# Patient Record
Sex: Female | Born: 1995 | Race: White | Hispanic: No | Marital: Married | Smoking: Current every day smoker
Health system: Southern US, Community
[De-identification: ages and names within clinical notes are randomized; demographics above are authoritative.]

## PROBLEM LIST (undated history)

## (undated) ENCOUNTER — Inpatient Hospital Stay: Payer: Self-pay

## (undated) DIAGNOSIS — J45909 Unspecified asthma, uncomplicated: Secondary | ICD-10-CM

---
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
## 2015-03-12 IMAGING — US US PELV - US TRANSVAGINAL
1 series · 14 of 25 positions shown · non-contrast
Comparison: None

CLINICAL DATA: Right lower pelvic pain.

EXAM:
TRANSABDOMINAL AND TRANSVAGINAL ULTRASOUND OF PELVIS
TECHNIQUE: Both transabdominal and transvaginal ultrasound examinations of the
pelvis were performed. Transabdominal technique was performed for
global imaging of the pelvis including uterus, ovaries, adnexal
regions, and pelvic cul-de-sac. It was necessary to proceed with
endovaginal exam following the transabdominal exam to visualize the
endometrium and ovaries.

[Series 1: us pelv - us transvaginal · 0.25mm/px · 14 of 56 slices shown]
[im 1/56]
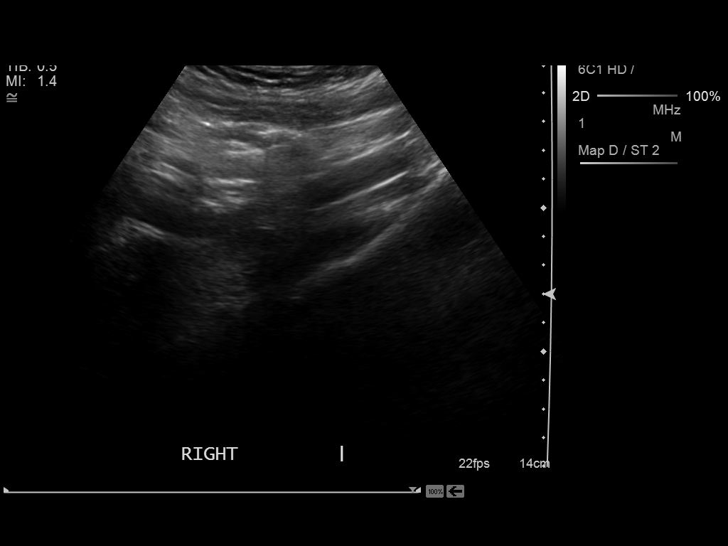
[im 5/56]
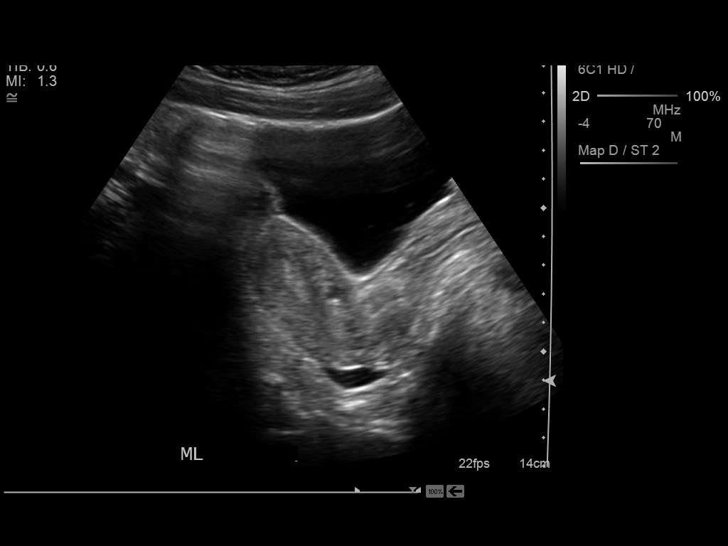
[im 10/56]
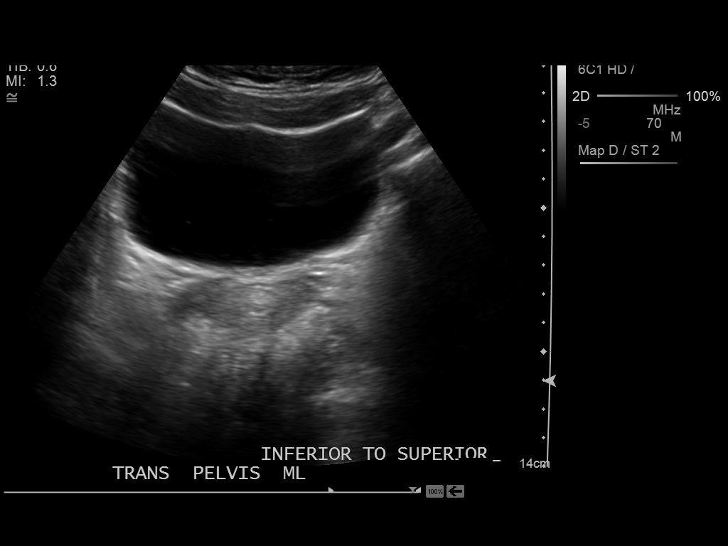
[im 14/56]
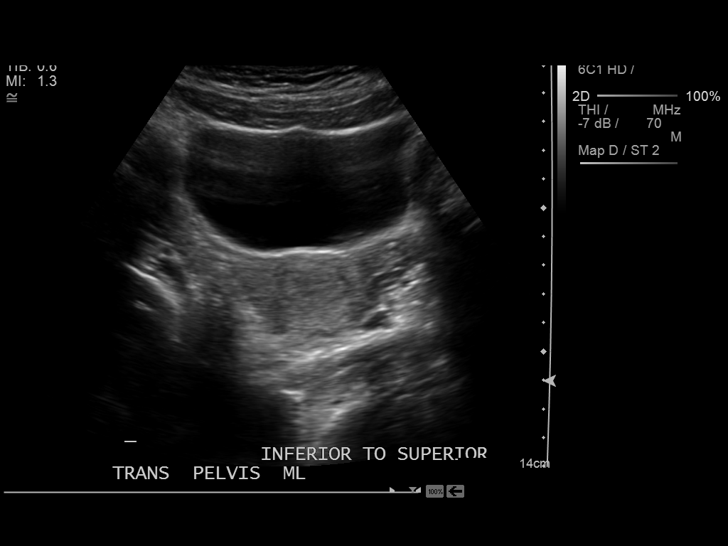
[im 19/56]
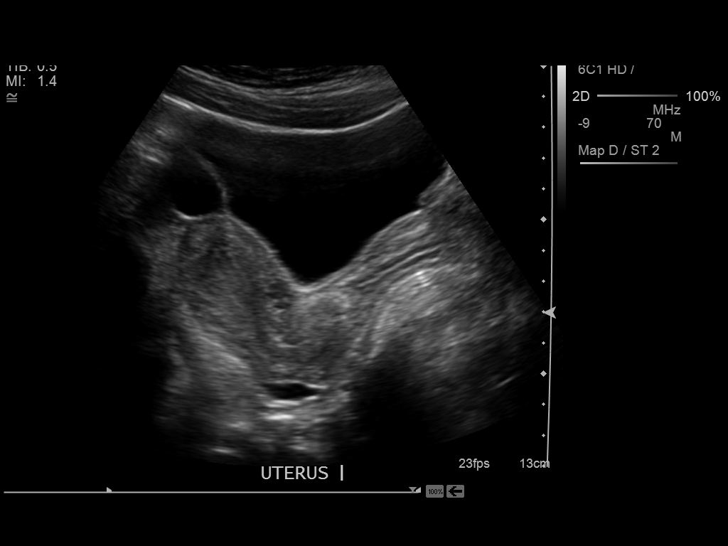
[im 21/56]
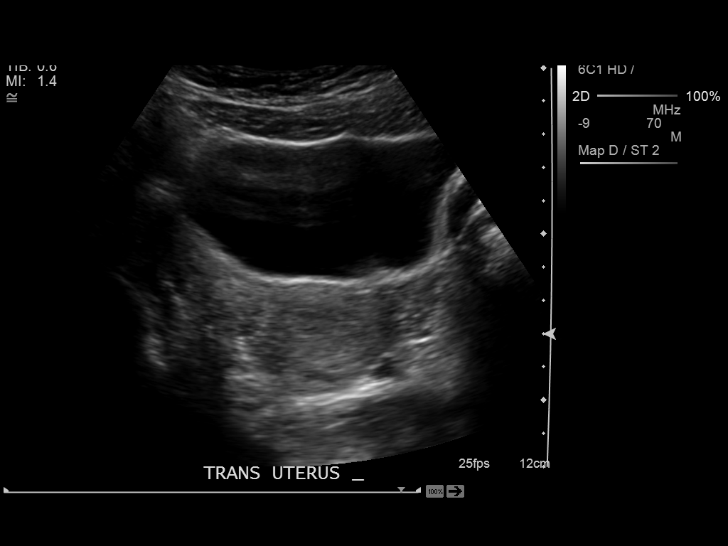
[im 26/56]
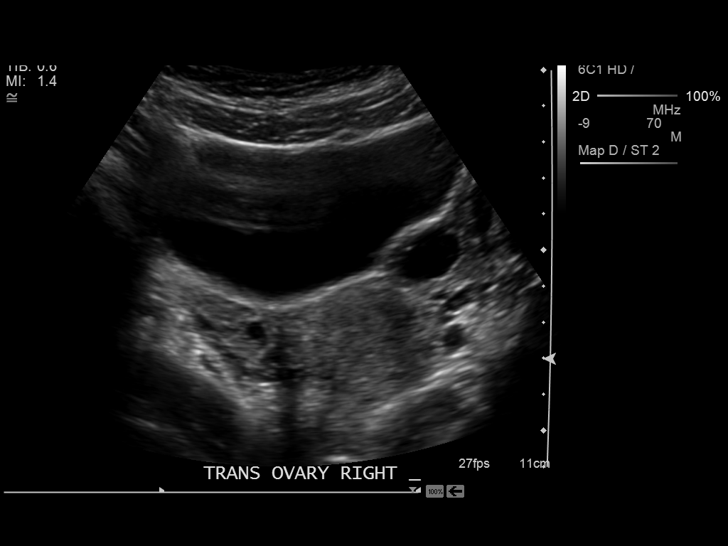
[im 30/56]
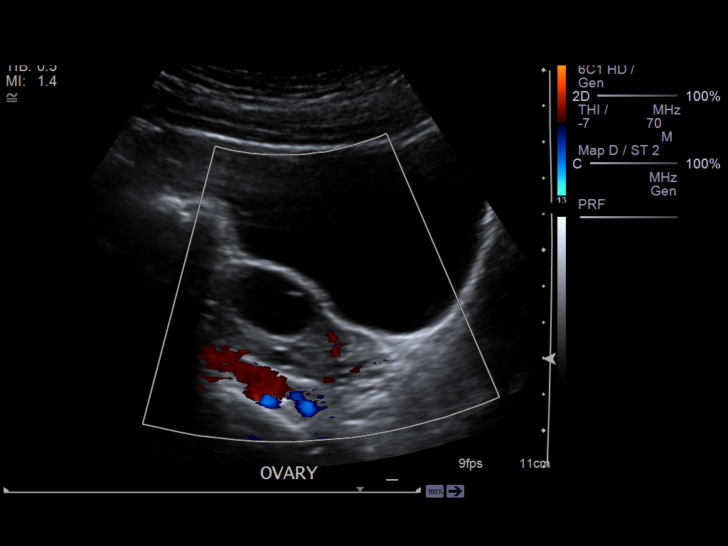
[im 35/56]
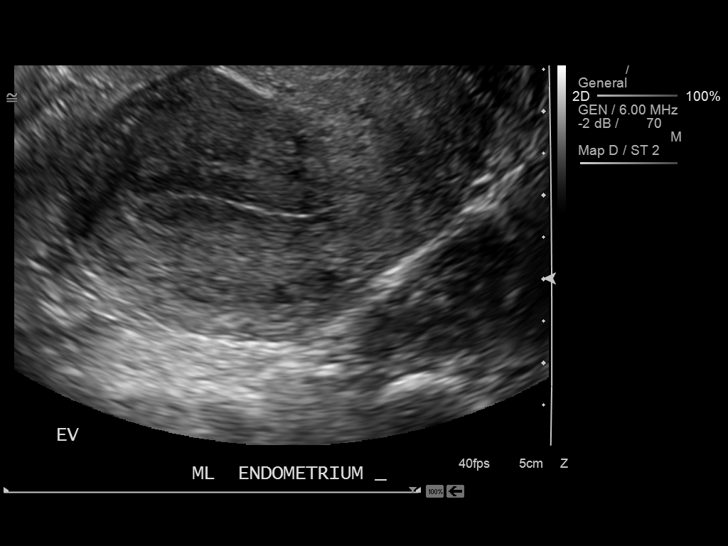
[im 37/56]
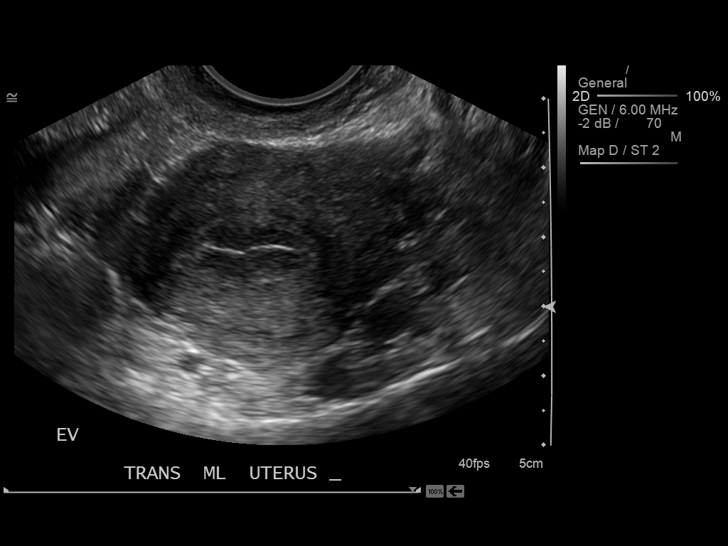
[im 42/56]
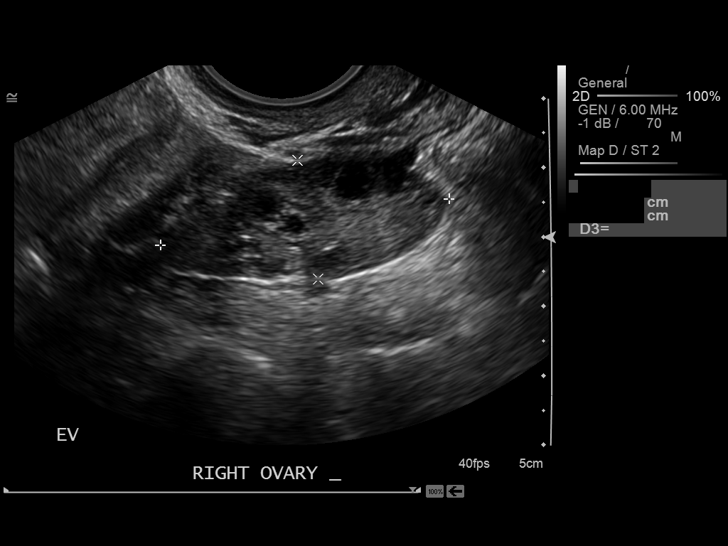
[im 46/56]
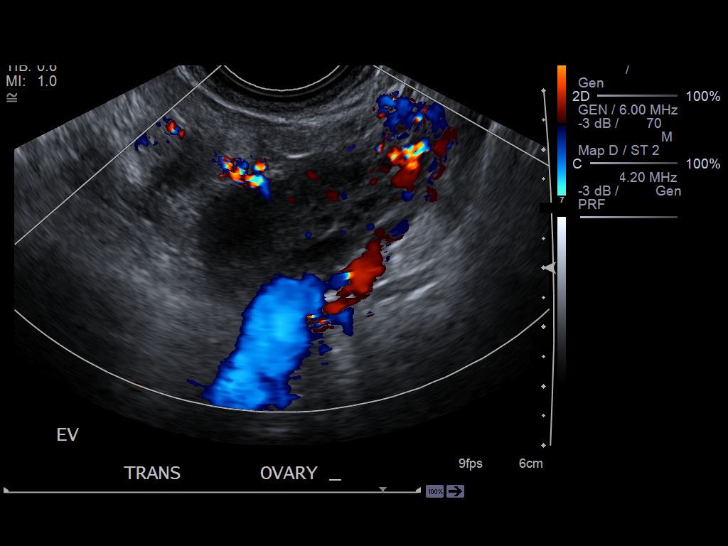
[im 51/56]
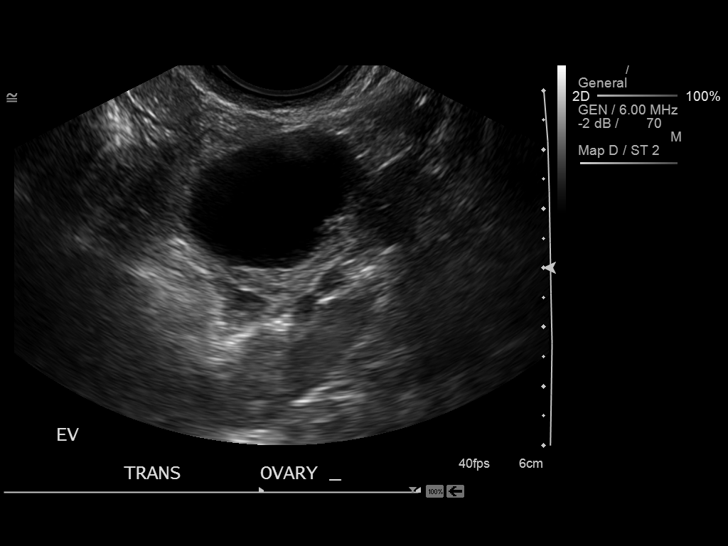
[im 56/56]
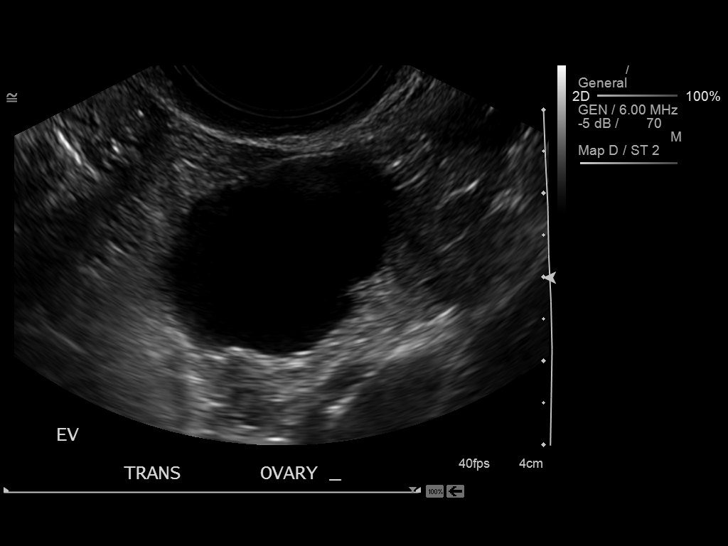

[14 of 25 positions shown; findings below may reference images not displayed]

FINDINGS: Uterus

Measurements: 7.1 x 4.5 x 3.3 cm. No fibroids or other mass
visualized.

Endometrium

Thickness: 9 mm.  No focal abnormality visualized.

Right ovary

Measurements: 4.2 x 2.3 x 1.7 cm. Normal appearance/no adnexal mass.

Left ovary

Measurements: 4.4 x 3.3 x 2.4 cm. 2.7 cm simple cyst is noted which
most likely is physiologic follicular cyst.

Other findings

Trace free fluid is noted in the pelvis which most likely is
physiologic.
IMPRESSION: No significant pelvic abnormality seen.

## 2015-11-01 ENCOUNTER — Encounter: Payer: Self-pay | Admitting: Emergency Medicine

## 2015-11-01 ENCOUNTER — Emergency Department
Admission: EM | Admit: 2015-11-01 | Discharge: 2015-11-02 | Disposition: A | Discharge status: Home / Self Care | Payer: Medicaid Other | Attending: Student in an Organized Health Care Education/Training Program | Admitting: Student in an Organized Health Care Education/Training Program

## 2015-11-01 ENCOUNTER — Emergency Department: Payer: Medicaid Other

## 2015-11-01 DIAGNOSIS — N644 Mastodynia: Secondary | ICD-10-CM | POA: Diagnosis not present

## 2015-11-01 DIAGNOSIS — O9229 Other disorders of breast associated with pregnancy and the puerperium: Secondary | ICD-10-CM

## 2015-11-01 DIAGNOSIS — Z87891 Personal history of nicotine dependence: Secondary | ICD-10-CM | POA: Diagnosis not present

## 2015-11-01 DIAGNOSIS — O209 Hemorrhage in early pregnancy, unspecified: Secondary | ICD-10-CM | POA: Insufficient documentation

## 2015-11-01 DIAGNOSIS — Z3A01 Less than 8 weeks gestation of pregnancy: Secondary | ICD-10-CM | POA: Insufficient documentation

## 2015-11-01 DIAGNOSIS — O26891 Other specified pregnancy related conditions, first trimester: Secondary | ICD-10-CM | POA: Diagnosis not present

## 2015-11-01 DIAGNOSIS — Z791 Long term (current) use of non-steroidal anti-inflammatories (NSAID): Secondary | ICD-10-CM | POA: Diagnosis not present

## 2015-11-01 DIAGNOSIS — O4690 Antepartum hemorrhage, unspecified, unspecified trimester: Secondary | ICD-10-CM

## 2015-11-01 DIAGNOSIS — J45909 Unspecified asthma, uncomplicated: Secondary | ICD-10-CM | POA: Insufficient documentation

## 2015-11-01 LAB — BASIC METABOLIC PANEL
ANION GAP: 9 (ref 5–15)
BUN: 8 mg/dL (ref 6–20)
CHLORIDE: 103 mmol/L (ref 101–111)
CO2: 25 mmol/L (ref 22–32)
Calcium: 8.8 mg/dL — ABNORMAL LOW (ref 8.9–10.3)
Creatinine, Ser: 0.55 mg/dL (ref 0.44–1.00)
GFR calc Af Amer: >60 mL/min (ref >60)
GLUCOSE: 117 mg/dL — AB (ref 65–99)
POTASSIUM: 3.7 mmol/L (ref 3.5–5.1)
SODIUM: 137 mmol/L (ref 135–145)

## 2015-11-01 LAB — URINALYSIS COMPLETE WITH MICROSCOPIC (ARMC ONLY)
BILIRUBIN URINE: NEGATIVE
Glucose, UA: NEGATIVE mg/dL
KETONES UR: NEGATIVE mg/dL
LEUKOCYTES UA: NEGATIVE
Nitrite: NEGATIVE
PH: 6 (ref 5.0–8.0)
PROTEIN: NEGATIVE mg/dL
Specific Gravity, Urine: 1.023 (ref 1.005–1.030)

## 2015-11-01 LAB — CBC
HCT: 38.8 % (ref 35.0–47.0)
HEMOGLOBIN: 13.5 g/dL (ref 12.0–16.0)
MCH: 31.2 pg (ref 26.0–34.0)
MCHC: 34.9 g/dL (ref 32.0–36.0)
MCV: 89.3 fL (ref 80.0–100.0)
PLATELETS: 200 10*3/uL (ref 150–440)
RBC: 4.35 MIL/uL (ref 3.80–5.20)
RDW: 12.5 % (ref 11.5–14.5)
WBC: 9.5 10*3/uL (ref 3.6–11.0)

## 2015-11-01 LAB — HCG, QUANTITATIVE, PREGNANCY: hCG, Beta Chain, Quant, S: 6873 m[IU]/mL — ABNORMAL HIGH (ref <5)

## 2015-11-01 NOTE — ED Notes (Addendum)
Pt. Returned to tx room from US. Pt. Returned to tx. room in stable condition.

## 2015-11-01 NOTE — ED Provider Notes (Signed)
[facility] Emergency Department Provider Note    First MD Initiated Contact with Patient 11/01/15 2240     (approximate)  I have reviewed the triage vital signs and the nursing notes.   HISTORY  Chief Complaint Vaginal Bleeding    HPI Katrina [name] is a 20 y.o. female G2P1001 who presents with 1 week of intermittent lower abdominal cramps and 1 day of a bright red and brown vaginal spotting. Patient denies any lightheadedness. No current abdominal pain. States that she's roughly 2 months by LMP but had only one cycle after having her IUD removed so it is uncertain of her menses timing. She denies any history of bleeding disorders. Has been established with OB/GYN. She is also complaining of right breast pain but denies any fevers.    Past Medical History:  Diagnosis Date  . Asthma     Patient Active Problem List   Diagnosis Date Noted  . Indication for care in labor or delivery 09/02/2014  . Lumbago 09/01/2014    History reviewed. No pertinent surgical history.  Prior to Admission medications   Medication Sig Start Date End Date Taking? Authorizing Provider  ibuprofen (ADVIL,MOTRIN) 600 MG tablet Take 1 tablet (600 mg total) by mouth every 6 (six) hours as needed for mild pain or moderate pain. 09/05/14   Nadara Mustardobert P Harris, MD  Prenatal Vit-Fe Fumarate-FA (PRENATAL MULTIVITAMIN) TABS tablet Take 1 tablet by mouth daily at 12 noon. 09/05/14   Nadara Mustardobert P Harris, MD    Allergies Review of patient's allergies indicates no known allergies.  No fam h/o bleeding disorders  Social History Social History  Substance Use Topics  . Smoking status: Former Games developermoker  . Smokeless tobacco: Never Used  . Alcohol use No    Review of Systems Patient denies headaches, rhinorrhea, blurry vision, numbness, shortness of breath, chest pain, edema, cough, abdominal pain, nausea, vomiting, diarrhea, dysuria, fevers, rashes or hallucinations unless otherwise stated  above in HPI. ____________________________________________   PHYSICAL EXAM:  VITAL SIGNS: Vitals:   11/01/15 2156  BP: 124/60  Pulse: 82  Resp: 16  Temp: 97.6 F (36.4 C)    Constitutional: Alert and oriented. Well appearing and in no acute distress. Eyes: Conjunctivae are normal. PERRL. EOMI. Head: Atraumatic. Nose: No congestion/rhinnorhea. Mouth/Throat: Mucous membranes are moist.  Oropharynx non-erythematous. Neck: No stridor. Painless ROM. No cervical spine tenderness to palpation Hematological/Lymphatic/Immunilogical: No cervical lymphadenopathy. Cardiovascular: Normal rate, regular rhythm. Grossly normal heart sounds.  Good peripheral circulation. Respiratory: Normal respiratory effort.  No retractions. Lungs CTAB. Gastrointestinal: Soft and nontender. No distention. No abdominal bruits. No CVA tenderness. Genitourinary: Healthy-appearing cervix with scant vaginal blood. No evidence of active bleeding. Cervix is closed. No vaginal lesions no vaginal discharge. Abdominal exam is soft and benign. Musculoskeletal: No lower extremity tenderness nor edema.  No joint effusions. Neurologic:  Normal speech and language. No gross focal neurologic deficits are appreciated. No gait instability. Skin:  Skin is warm, dry and intact. No rash noted. Psychiatric: Mood and affect are normal. Speech and behavior are normal.  ____________________________________________   LABS (all labs ordered are listed, but only abnormal results are displayed)  Results for orders placed or performed during the hospital encounter of 11/01/15 (from the past 24 hour(s))  hCG, quantitative, pregnancy     Status: Abnormal   Collection Time: 11/01/15  9:57 PM  Result Value Ref Range   hCG, Beta Chain, Quant, S 6,873 (H) <5 mIU/mL  CBC     Status:  None   Collection Time: 11/01/15  9:57 PM  Result Value Ref Range   WBC 9.5 3.6 - 11.0 K/uL   RBC 4.35 3.80 - 5.20 MIL/uL   Hemoglobin 13.5 12.0 - 16.0 g/dL     HCT 16.138.8 09.635.0 - 04.547.0 %   MCV 89.3 80.0 - 100.0 fL   MCH 31.2 26.0 - 34.0 pg   MCHC 34.9 32.0 - 36.0 g/dL   RDW 40.912.5 81.111.5 - 91.414.5 %   Platelets 200 150 - 440 K/uL  Basic metabolic panel     Status: Abnormal   Collection Time: 11/01/15  9:57 PM  Result Value Ref Range   Sodium 137 135 - 145 mmol/L   Potassium 3.7 3.5 - 5.1 mmol/L   Chloride 103 101 - 111 mmol/L   CO2 25 22 - 32 mmol/L   Glucose, Bld 117 (H) 65 - 99 mg/dL   BUN 8 6 - 20 mg/dL   Creatinine, Ser 7.820.55 0.44 - 1.00 mg/dL   Calcium 8.8 (L) 8.9 - 10.3 mg/dL   GFR calc non Af Amer >60 >60 mL/min   GFR calc Af Amer >60 >60 mL/min   Anion gap 9 5 - 15  Urinalysis complete, with microscopic ([facility] only)     Status: Abnormal   Collection Time: 11/01/15  9:57 PM  Result Value Ref Range   Color, Urine YELLOW (A) YELLOW   APPearance HAZY (A) CLEAR   Glucose, UA NEGATIVE NEGATIVE mg/dL   Bilirubin Urine NEGATIVE NEGATIVE   Ketones, ur NEGATIVE NEGATIVE mg/dL   Specific Gravity, Urine 1.023 1.005 - 1.030   Hgb urine dipstick 1+ (A) NEGATIVE   pH 6.0 5.0 - 8.0   Protein, ur NEGATIVE NEGATIVE mg/dL   Nitrite NEGATIVE NEGATIVE   Leukocytes, UA NEGATIVE NEGATIVE   RBC / HPF 0-5 0 - 5 RBC/hpf   WBC, UA 0-5 0 - 5 WBC/hpf   Bacteria, UA RARE (A) NONE SEEN   Squamous Epithelial / LPF 6-30 (A) NONE SEEN   Mucous PRESENT    ____________________________________________  EKG____________________________________________  RADIOLOGY  I personally reviewed all radiographic images ordered to evaluate for the above acute complaints and reviewed radiology reports and findings.  These findings were personally discussed with the patient.  Please see medical record for radiology report.  ____________________________________________   PROCEDURES  Procedure(s) performed: none    Critical Care performed: no ____________________________________________   INITIAL IMPRESSION / ASSESSMENT AND PLAN / ED COURSE  Pertinent labs & imaging  results that were available during my care of the patient were reviewed by me and considered in my medical decision making (see chart for details).  DDX: ectopic, misscarriage, threatened ab, dub, menses  Kimberlye Pollie MeyerLeigh Ambrosino is a 20 y.o. who presents to the ED with  Early vaginal bleeding and early pregnancy. Patient hemodynamic stable. Ultrasound ordered to evaluate for intrauterine pregnancy shows evidence of intrauterine gestational sac. Beta Quant is 6000. Based on her exam think that she does have threatened AB in early pregnancy. Likely 5-6 weeks. She has otherwise reassuring exam. We will discharge with instructions for follow-up in OB clinic in 2-3 days for repeat Quant. Patient also complaining of right breast pain. Rh-.  Doesn't feel engorged without any evidence of abscess or overlying cellulitis. Patient just recently stopped breast-feeding her son and is likely suffering from engorgement. Encourage conservative measures. Do not feel that antibiotics clinically indicated at this time.  Have discussed with the patient and available family all diagnostics and treatments performed  thus far and all questions were answered to the best of my ability. The patient demonstrates understanding and agreement with plan.   Clinical Course     ____________________________________________   FINAL CLINICAL IMPRESSION(S) / ED DIAGNOSES  Final diagnoses:  Vaginal bleeding in pregnancy  Breast pain during pregnancy      NEW MEDICATIONS STARTED DURING THIS VISIT:  New Prescriptions   No medications on file     Note:  This document was prepared using Dragon voice recognition software and may include unintentional dictation errors.    Willy Eddy, MD 11/01/15 [phone]

## 2015-11-01 NOTE — ED Triage Notes (Signed)
Patient ambulatory to triage with steady gait, without difficulty or distress noted; pt reports vag bleeding and abd cramping, approx 2mos pregnant

## 2015-11-01 NOTE — ED Notes (Signed)
MD Robinson at bedside at this time.  

## 2015-11-02 NOTE — ED Notes (Signed)
Discharge instructions reviewed with patient. Questions fielded by this RN. Patient verbalizes understanding of instructions. Patient discharged home in stable condition per Robinson MD . No acute distress noted at time of discharge.   

## 2015-11-03 ENCOUNTER — Emergency Department
Admission: EM | Admit: 2015-11-03 | Discharge: 2015-11-03 | Disposition: A | Discharge status: Home / Self Care | Payer: Medicaid Other | Attending: Emergency Medicine | Admitting: Emergency Medicine

## 2015-11-03 ENCOUNTER — Encounter: Payer: Self-pay | Admitting: Emergency Medicine

## 2015-11-03 DIAGNOSIS — J45909 Unspecified asthma, uncomplicated: Secondary | ICD-10-CM | POA: Diagnosis not present

## 2015-11-03 DIAGNOSIS — O200 Threatened abortion: Secondary | ICD-10-CM | POA: Insufficient documentation

## 2015-11-03 DIAGNOSIS — Z791 Long term (current) use of non-steroidal anti-inflammatories (NSAID): Secondary | ICD-10-CM | POA: Diagnosis not present

## 2015-11-03 DIAGNOSIS — Z87891 Personal history of nicotine dependence: Secondary | ICD-10-CM | POA: Diagnosis not present

## 2015-11-03 DIAGNOSIS — O209 Hemorrhage in early pregnancy, unspecified: Secondary | ICD-10-CM | POA: Diagnosis present

## 2015-11-03 DIAGNOSIS — Z3A08 8 weeks gestation of pregnancy: Secondary | ICD-10-CM | POA: Diagnosis not present

## 2015-11-03 LAB — BASIC METABOLIC PANEL
Anion gap: 9 (ref 5–15)
BUN: 8 mg/dL (ref 6–20)
CALCIUM: 8.8 mg/dL — AB (ref 8.9–10.3)
CO2: 22 mmol/L (ref 22–32)
CREATININE: 0.57 mg/dL (ref 0.44–1.00)
Chloride: 106 mmol/L (ref 101–111)
GFR calc non Af Amer: >60 mL/min (ref >60)
GLUCOSE: 85 mg/dL (ref 65–99)
Potassium: 3.9 mmol/L (ref 3.5–5.1)
Sodium: 137 mmol/L (ref 135–145)

## 2015-11-03 LAB — CBC
HEMATOCRIT: 38.9 % (ref 35.0–47.0)
Hemoglobin: 13.9 g/dL (ref 12.0–16.0)
MCH: 31.8 pg (ref 26.0–34.0)
MCHC: 35.7 g/dL (ref 32.0–36.0)
MCV: 89.1 fL (ref 80.0–100.0)
Platelets: 192 10*3/uL (ref 150–440)
RBC: 4.36 MIL/uL (ref 3.80–5.20)
RDW: 12.3 % (ref 11.5–14.5)
WBC: 7.6 10*3/uL (ref 3.6–11.0)

## 2015-11-03 LAB — HCG, QUANTITATIVE, PREGNANCY: HCG, BETA CHAIN, QUANT, S: 5583 m[IU]/mL — AB (ref <5)

## 2015-11-03 MED ORDER — SODIUM CHLORIDE 0.9 % IV BOLUS (SEPSIS): 1000.0000 mL | Freq: Once | INTRAVENOUS | Status: DC

## 2015-11-03 NOTE — ED Triage Notes (Signed)
Pt seen here on 10/31, approx [redacted] weeks pregnant. Reports some brown vaginal bleeding. Woke up this AM with bright red blood, filled 3 pads in 3 hours and is having lower abdominal cramping.

## 2015-11-03 NOTE — ED Notes (Signed)
Red, purple and light green top sent.

## 2015-11-03 NOTE — ED Notes (Signed)
Pt to the bathroom. Pt given gown and pelvic cart at doorway

## 2015-11-03 NOTE — ED Notes (Signed)
Hold on type and screen for now per dr.

## 2015-11-03 NOTE — ED Provider Notes (Signed)
[facility] Emergency Department Provider Note  ____________________________________________  Time seen: Approximately 2:20 PM  I have reviewed the triage vital signs and the nursing notes.   HISTORY  Chief Complaint Vaginal Bleeding   HPI Katrina [name] is a 20 y.o. female G2P1001 who presents for evaluation of worsening vaginal bleeding. Patient was seen here 2 days ago complaining of vaginal bleeding. Patient was roughly 2 months from her last menstrual period. She had a transvaginal ultrasound showing a early intrauterine gestational sac but no fetal pole. Her hCG was 6873. Patient was discharged home to follow up with primary care doctor in 2 weeks for repeat ultrasound. She complains that she continues to bleed and today the bleeding intensified. She reports that she's been changing a pad an hour and also has had some mild lightheadedness which prompted her visit to the emergency room. She reports that she was having abdominal cramping on her last visit however abdominal pain has resolved. No nausea, no vomiting, no chest pain, no shortness of breath, no vaginal discharge, no dysuria.  Past Medical History:  Diagnosis Date  . Asthma     Patient Active Problem List   Diagnosis Date Noted  . Indication for care in labor or delivery 09/02/2014  . Lumbago 09/01/2014    No past surgical history on file.  Prior to Admission medications   Medication Sig Start Date End Date Taking? Authorizing Provider  ibuprofen (ADVIL,MOTRIN) 600 MG tablet Take 1 tablet (600 mg total) by mouth every 6 (six) hours as needed for mild pain or moderate pain. 09/05/14   Nadara Mustardobert P Harris, MD  Prenatal Vit-Fe Fumarate-FA (PRENATAL MULTIVITAMIN) TABS tablet Take 1 tablet by mouth daily at 12 noon. 09/05/14   Nadara Mustardobert P Harris, MD    Allergies Review of patient's allergies indicates no known allergies.  No family history on file.  Social History Social History  Substance Use  Topics  . Smoking status: Former Games developermoker  . Smokeless tobacco: Never Used  . Alcohol use No    Review of Systems  Constitutional: Negative for fever. + Lightheadedness Eyes: Negative for visual changes. ENT: Negative for sore throat. Cardiovascular: Negative for chest pain. Respiratory: Negative for shortness of breath. Gastrointestinal: Negative for abdominal pain, vomiting or diarrhea. Genitourinary: Negative for dysuria. + vaginal bleeding Musculoskeletal: Negative for back pain. Skin: Negative for rash. Neurological: Negative for headaches, weakness or numbness.  ____________________________________________   PHYSICAL EXAM:  VITAL SIGNS: ED Triage Vitals  Enc Vitals Group     BP 11/03/15 1300 124/67     Pulse Rate 11/03/15 1300 86     Resp 11/03/15 1300 16     Temp 11/03/15 1300 98 F (36.7 C)     Temp Source 11/03/15 1300 Oral     SpO2 11/03/15 1300 100 %     Weight 11/03/15 1300 102 lb (46.3 kg)     Height 11/03/15 1300 4\' 10"  (1.473 m)     Head Circumference --      Peak Flow --      Pain Score 11/03/15 1302 0     Pain Loc --      Pain Edu? --      Excl. in GC? --     Constitutional: Alert and oriented. Well appearing and in no apparent distress. HEENT:      Head: Normocephalic and atraumatic.         Eyes: Conjunctivae are normal. Sclera is non-icteric. EOMI. PERRL  Mouth/Throat: Mucous membranes are moist.       Neck: Supple with no signs of meningismus. Cardiovascular: Regular rate and rhythm. No murmurs, gallops, or rubs. 2+ symmetrical distal pulses are present in all extremities. No JVD. Respiratory: Normal respiratory effort. Lungs are clear to auscultation bilaterally. No wheezes, crackles, or rhonchi.  Gastrointestinal: Soft, non tender, and non distended with positive bowel sounds. No rebound or guarding. Pelvic exam: Normal external genitalia, no rashes or lesions. Normal cervical mucus. Os closed with small amount of blood coming from the os  into the vaginal vault. No cervical motion tenderness.  No uterine or adnexal tenderness.   Musculoskeletal: Nontender with normal range of motion in all extremities. No edema, cyanosis, or erythema of extremities. Neurologic: Normal speech and language. Face is symmetric. Moving all extremities. No gross focal neurologic deficits are appreciated. Skin: Skin is warm, dry and intact. No rash noted. Psychiatric: Mood and affect are normal. Speech and behavior are normal.  ____________________________________________   LABS (all labs ordered are listed, but only abnormal results are displayed)  Labs Reviewed  HCG, QUANTITATIVE, PREGNANCY - Abnormal; Notable for the following:       Result Value   hCG, Beta Chain, Quant, S 5,583 (*)    All other components within normal limits  BASIC METABOLIC PANEL - Abnormal; Notable for the following:    Calcium 8.8 (*)    All other components within normal limits  CBC  TYPE AND SCREEN   ____________________________________________  EKG  none ____________________________________________  RADIOLOGY  none  ____________________________________________   PROCEDURES  Procedure(s) performed: None Procedures Critical Care performed:  None ____________________________________________   INITIAL IMPRESSION / ASSESSMENT AND PLAN / ED COURSE  20 y.o. female G2P1001 currently at estimated [redacted] weeks GA by LMP who presents for evaluation of worsening vaginal bleeding since being seen here 2 days ago. Patient is well-appearing, in no distress, has normal vital signs, her abdomen is soft and nontender throughout, pelvic exam showed a closed os with small amount of blood coming from it. Patient confirmatory intrauterine gestational sac on ultrasound 2 days ago and with no abdominal pain therefore no indication for imaging at this time. We'll check her blood counts and if that's normal we'll discharge home with follow-up with PCP. I explained to the patient  that the fact that her beta Sharene ButtersQuant is trending down that this is consistent with a miscarriage.  Clinical Course   _________________________ 2:41 PM on 11/03/2015 ----------------------------------------- Patient with normal blood count with hemoglobin of 13.9 and normal kidney function. Patient's blood type is A+ so no indication for [place]. We'll discharge home with supportive care follow-up with primary care doctor.   Pertinent labs & imaging results that were available during my care of the patient were reviewed by me and considered in my medical decision making (see chart for details).    ____________________________________________   FINAL CLINICAL IMPRESSION(S) / ED DIAGNOSES  Final diagnoses:  Threatened miscarriage      NEW MEDICATIONS STARTED DURING THIS VISIT:  New Prescriptions   No medications on file     Note:  This document was prepared using Dragon voice recognition software and may include unintentional dictation errors.    Nita Sicklearolina Romin Divita, MD 11/03/15 [phone]

## 2015-11-06 ENCOUNTER — Encounter: Payer: Self-pay | Admitting: Emergency Medicine

## 2015-11-06 ENCOUNTER — Emergency Department
Admission: EM | Admit: 2015-11-06 | Discharge: 2015-11-06 | Disposition: A | Discharge status: Home / Self Care | Payer: Medicaid Other | Attending: Emergency Medicine | Admitting: Emergency Medicine

## 2015-11-06 DIAGNOSIS — J45909 Unspecified asthma, uncomplicated: Secondary | ICD-10-CM | POA: Diagnosis not present

## 2015-11-06 DIAGNOSIS — O039 Complete or unspecified spontaneous abortion without complication: Secondary | ICD-10-CM | POA: Diagnosis not present

## 2015-11-06 DIAGNOSIS — Z791 Long term (current) use of non-steroidal anti-inflammatories (NSAID): Secondary | ICD-10-CM | POA: Insufficient documentation

## 2015-11-06 DIAGNOSIS — N898 Other specified noninflammatory disorders of vagina: Secondary | ICD-10-CM | POA: Insufficient documentation

## 2015-11-06 DIAGNOSIS — Z87891 Personal history of nicotine dependence: Secondary | ICD-10-CM | POA: Diagnosis not present

## 2015-11-06 DIAGNOSIS — O209 Hemorrhage in early pregnancy, unspecified: Secondary | ICD-10-CM | POA: Diagnosis present

## 2015-11-06 LAB — BASIC METABOLIC PANEL
ANION GAP: 8 (ref 5–15)
BUN: 6 mg/dL (ref 6–20)
CALCIUM: 9.3 mg/dL (ref 8.9–10.3)
CO2: 26 mmol/L (ref 22–32)
CREATININE: 0.5 mg/dL (ref 0.44–1.00)
Chloride: 104 mmol/L (ref 101–111)
Glucose, Bld: 98 mg/dL (ref 65–99)
Potassium: 3.9 mmol/L (ref 3.5–5.1)
SODIUM: 138 mmol/L (ref 135–145)

## 2015-11-06 LAB — CBC
HEMATOCRIT: 37.6 % (ref 35.0–47.0)
Hemoglobin: 13.4 g/dL (ref 12.0–16.0)
MCH: 31.4 pg (ref 26.0–34.0)
MCHC: 35.7 g/dL (ref 32.0–36.0)
MCV: 88.1 fL (ref 80.0–100.0)
PLATELETS: 230 10*3/uL (ref 150–440)
RBC: 4.27 MIL/uL (ref 3.80–5.20)
RDW: 12.5 % (ref 11.5–14.5)
WBC: 7.3 10*3/uL (ref 3.6–11.0)

## 2015-11-06 LAB — HCG, QUANTITATIVE, PREGNANCY: hCG, Beta Chain, Quant, S: 3081 m[IU]/mL — ABNORMAL HIGH (ref <5)

## 2015-11-06 LAB — TYPE AND SCREEN
ABO/RH(D): A POS
ANTIBODY SCREEN: NEGATIVE

## 2015-11-06 MED ORDER — HYDROCODONE-ACETAMINOPHEN 5-325 MG PO TABS: 1.0000 | ORAL_TABLET | ORAL | 0 refills | Status: DC | PRN

## 2015-11-06 NOTE — Discharge Instructions (Signed)
Please take ibuprofen as needed for discomfort, you may take her prescribed pain medication if needed for significant discomfort. Please return to the emergency department for any significant increase in bleeding, or any increased abdominal discomfort. Your workup today is unfortunately consistent with an active miscarriage. As we discussed bleeding will typically last 5-7 days and will be heavier than a typical period. Please call your doctor/OB/GYN for repeat evaluation in the next several days.

## 2015-11-06 NOTE — ED Triage Notes (Signed)
Pt reports recently had miscarriage. Pt states seen  10/31 and 11/02. Pt states was told she had a threatened miscarriage and the bleeding could continue for two weeks. Pt states the bleeding is getting worse and she is passing clots. Pt states she has had to use four pads in the last four hours.

## 2015-11-06 NOTE — ED Provider Notes (Addendum)
[facility] Emergency Department Provider Note  Time seen: 1:53 PM  I have reviewed the triage vital signs and the nursing notes.   HISTORY  Chief Complaint Vaginal Bleeding    HPI Katrina [name] is a 20 y.o. female With a past medical history of asthma, G2 P1 who presents the emergency department with a likely miscarriage. According to the patient she began having very light spotting about 1 week ago. She was seen in the emergency department 11/01/15 diagnosed with a threatened miscarriage at that time. She returned 2 days later 11/03/15 and was found have a declining beta hCG level, and told that she was likely miscarrying. Patient states approximately 4 hours ago she began with heavy bleeding with clot production. States she became concerned so she came to the emergency department for evaluation. Patient states moderate lower abdominal cramping. States she was going through 1 pad per hour. States she began feeling a little lightheaded so she came to the emergency department.  Past Medical History:  Diagnosis Date  . Asthma     Patient Active Problem List   Diagnosis Date Noted  . Indication for care in labor or delivery 09/02/2014  . Lumbago 09/01/2014    No past surgical history on file.  Prior to Admission medications   Medication Sig Start Date End Date Taking? Authorizing Provider  ibuprofen (ADVIL,MOTRIN) 600 MG tablet Take 1 tablet (600 mg total) by mouth every 6 (six) hours as needed for mild pain or moderate pain. 09/05/14   Nadara Mustardobert P Harris, MD  Prenatal Vit-Fe Fumarate-FA (PRENATAL MULTIVITAMIN) TABS tablet Take 1 tablet by mouth daily at 12 noon. 09/05/14   Nadara Mustardobert P Harris, MD    No Known Allergies  No family history on file.  Social History Social History  Substance Use Topics  . Smoking status: Former Games developermoker  . Smokeless tobacco: Never Used  . Alcohol use No    Review of Systems Constitutional: Negative for fever. Cardiovascular:  Negative for chest pain. Respiratory: Negative for shortness of breath. Gastrointestinal: lower abdominal cramping, moderate. Genitourinary: Negative for dysuria.positive for vaginal bleeding. Neurological: Negative for headache 10-point ROS otherwise negative.  ____________________________________________   PHYSICAL EXAM:  VITAL SIGNS: ED Triage Vitals  Enc Vitals Group     BP 11/06/15 1257 108/64     Pulse Rate 11/06/15 1257 98     Resp 11/06/15 1257 17     Temp 11/06/15 1257 98.2 F (36.8 C)     Temp Source 11/06/15 1257 Oral     SpO2 11/06/15 1257 99 %     Weight 11/06/15 1258 102 lb (46.3 kg)     Height 11/06/15 1258 4\' 10"  (1.473 m)     Head Circumference --      Peak Flow --      Pain Score 11/06/15 1302 0     Pain Loc --      Pain Edu? --      Excl. in GC? --     Constitutional: Alert and oriented. Well appearing and in no distress. Eyes: Normal exam ENT   Head: Normocephalic and atraumatic.   Mouth/Throat: Mucous membranes are moist. Cardiovascular: Normal rate, regular rhythm. No murmurs, rubs, or gallops. Respiratory: Normal respiratory effort without tachypnea nor retractions. Breath sounds are clear  Gastrointestinal: soft, moderate suprapubic tenderness palpation. No rebound or guarding. No distention. Musculoskeletal: Nontender with normal range of motion in all extremities. No lower extremity tenderness or edema. Neurologic:  Normal speech and language. No  gross focal neurologic deficits  Skin:  Skin is warm, dry and intact.  Psychiatric: Mood and affect are normal. Speech and behavior are normal.   ____________________________________________    INITIAL IMPRESSION / ASSESSMENT AND PLAN / ED COURSE  Pertinent labs & imaging results that were available during my care of the patient were reviewed by me and considered in my medical decision making (see chart for details).  patient presents to the emergency department with heavy vaginal bleeding  for the past 4 hours. Overall the patient appears very well, no distress, moderate suprapubic tenderness palpation but no rebound or guarding. Patient had not some performed 11/01/15 showing a possible gestational sac. We're currently awaiting beta hCG level from today. I anticipate that it has declined substantially and the patient is likely undergoing an active miscarriage. We will perform a pelvic examination while awaiting lab results.  Patient's H&H is normal. Currently awaiting beta hCG level.  Pelvic exam shows a moderate size clot in the vaginal vault, cervix appears normal, minimal bleeding from the cervix at this time. Exam consistent with uterine bleeding likely miscarriage. Beta-hCG continues to decline also consistent with miscarriage.  ____________________________________________   FINAL CLINICAL IMPRESSION(S) / ED DIAGNOSES  miscarriage    Minna AntisKevin Dontavia Brand, MD 11/06/15 1431    Minna AntisKevin Monna Crean, MD 11/06/15 [phone]

## 2015-11-06 NOTE — ED Notes (Signed)
Pt alert and oriented X4, active, cooperative, pt in NAD. RR even and unlabored, color WNL.  Pt informed to return if any life threatening symptoms occur.   

## 2015-11-06 NOTE — ED Notes (Addendum)
Pt c/o heavy vaginal bleeding that started at 12AM today. Pt here recently for miscarriage. Denies pain, but does appear uncomfortable when abdomen palpated. PT reports mild fatigue but requests to try PO fluids vs IV fluids when MD at bedside. Pt concerned with large blood clot that she has passed and heavy amount of bleeding (4 pads in 1 hour). Pt given large cup of water and snack. Given more pads and underwear. Pt alert and oriented X4, active, cooperative, pt in NAD. RR even and unlabored, color WNL.  Pt ambulates with ease.

## 2016-03-06 ENCOUNTER — Other Ambulatory Visit: Payer: Self-pay | Admitting: Obstetrics & Gynecology

## 2016-03-11 ENCOUNTER — Emergency Department
Admission: EM | Admit: 2016-03-11 | Discharge: 2016-03-11 | Disposition: A | Discharge status: Home / Self Care | Payer: Medicaid Other | Attending: Emergency Medicine | Admitting: Emergency Medicine

## 2016-03-11 ENCOUNTER — Emergency Department: Payer: Medicaid Other

## 2016-03-11 DIAGNOSIS — Z87891 Personal history of nicotine dependence: Secondary | ICD-10-CM | POA: Insufficient documentation

## 2016-03-11 DIAGNOSIS — J45901 Unspecified asthma with (acute) exacerbation: Secondary | ICD-10-CM | POA: Insufficient documentation

## 2016-03-11 MED ORDER — ALBUTEROL SULFATE HFA 108 (90 BASE) MCG/ACT IN AERS: 2.0000 | INHALATION_SPRAY | Freq: Four times a day (QID) | RESPIRATORY_TRACT | 2 refills | Status: DC | PRN

## 2016-03-11 MED ORDER — PREDNISONE 20 MG PO TABS: 40.0000 mg | ORAL_TABLET | Freq: Every day | ORAL | 0 refills | Status: DC

## 2016-03-11 MED ORDER — IPRATROPIUM-ALBUTEROL 0.5-2.5 (3) MG/3ML IN SOLN
3.0000 mL | Freq: Once | RESPIRATORY_TRACT | Status: AC
  Administered 2016-03-11: 3 mL via RESPIRATORY_TRACT
  Filled 2016-03-11: qty 3

## 2016-03-11 MED ORDER — METHYLPREDNISOLONE SODIUM SUCC 125 MG IJ SOLR
125.0000 mg | Freq: Once | INTRAMUSCULAR | Status: AC
  Administered 2016-03-11: 125 mg via INTRAVENOUS
  Filled 2016-03-11: qty 2

## 2016-03-11 NOTE — ED Triage Notes (Signed)
Pt came to ED via EMS c/o sob. Pt has history of asthma, ran out of inhaler. Allergic to dogs, reports after giving her dog a bath, started feeling sob. Uses neb treatments at home, last one this morning. Given 1 duoneb by EMS. HR 120 upon arrival.

## 2016-03-11 NOTE — ED Provider Notes (Signed)
Time Seen: Approximately 1603  I have reviewed the triage notes  Chief Complaint: Shortness of Breath   History of Present Illness: Katrina [name] is a 21 y.o. female *who has a long history of asthma. Patient notified EMS after she gave herself 3 nebulizer treatments at home without relief. She apparently has an allergy to dogs and was attempting to give her dog a bath. She denies any fever, chills, chest pain, productive cough. He was transported here by EMS and was given a DuoNeb in transport and feels slightly symptomatically improved. She states she still feels "" tightness in the chest "". She denies any history of pulmonary embolism or risk factors. No previous hospitalizations for her asthma and usually easily corrected  Past Medical History:  Diagnosis Date  . Asthma     Patient Active Problem List   Diagnosis Date Noted  . Indication for care in labor or delivery 09/02/2014  . Lumbago 09/01/2014    History reviewed. No pertinent surgical history.  History reviewed. No pertinent surgical history.  Current Outpatient Rx  . Order #: [id number] Class: Print  . Order #: [id number] Class: Normal  . Order #: [id number] Class: Print  . Order #: [id number] Class: Normal    Allergies:  Patient has no known allergies.  Family History: No family history on file.  Social History: Social History  Substance Use Topics  . Smoking status: Former Games developermoker  . Smokeless tobacco: Never Used  . Alcohol use No     Review of Systems:   10 point review of systems was performed and was otherwise negative:  Constitutional: No fever Eyes: No visual disturbances ENT: No sore throat, ear pain Cardiac: Mild chest tightness Respiratory: No shortness of breath, wheezing, or stridor Abdomen: No abdominal pain, no vomiting, No diarrhea Endocrine: No weight loss, No night sweats Extremities: No peripheral edema, cyanosis Skin: No rashes, easy bruising Neurologic: No focal weakness,  trouble with speech or swollowing Urologic: No dysuria, Hematuria, or urinary frequency Patient denies any risk of being pregnant  Physical Exam:  ED Triage Vitals [03/11/16 1601]  Enc Vitals Group     BP 120/76     Pulse Rate (!) 128     Resp 18     Temp 97.8 F (36.6 C)     Temp Source Oral     SpO2 96 %     Weight 112 lb (50.8 kg)     Height 4\' 10"  (1.473 m)     Head Circumference      Peak Flow      Pain Score      Pain Loc      Pain Edu?      Excl. in GC?     General: Awake , Alert , and Oriented times 3; GCS 15 Appears anxious, jittery. Post-breathing treatment. She can speak in complete sentences with some mild upper respiratory retractions Head: Normal cephalic , atraumatic Eyes: Pupils equal , round, reactive to light Nose/Throat: No nasal drainage, patent upper airway without erythema or exudate.  Neck: Supple, Full range of motion, No anterior adenopathy or palpable thyroid masses Lungs: Limited inspiratory effort with some and expiratory wheezing heard primarily at the apices Heart: Tachycardic without murmurs gallops or rubs Abdomen: Soft, non tender without rebound, guarding , or rigidity; bowel sounds positive and symmetric in all 4 quadrants. No organomegaly .        Extremities: 2 plus symmetric pulses. No edema, clubbing or cyanosis. Negative Homans sign Neurologic: normal ambulation, Motor  symmetric without deficits, sensory intact Skin: warm, dry, no rashes   EKG:  ED ECG REPORT I, Jennye Moccasin, the attending physician, personally viewed and interpreted this ECG.  Date: 03/11/2016 EKG Time:1556 Rate:138 Rhythm: Sinus tachycardia QRS Axis: normal Intervals: normal ST/T Wave abnormalities: Nonspecific ST abnormalities likely rate dependent Conduction Disturbances: none Narrative Interpretation: unremarkable    Radiology: "Dg Chest 2 View  Result Date: 03/11/2016 CLINICAL DATA:  21 year old female with shortness of breath today. EXAM: CHEST   2 VIEW COMPARISON:  None. FINDINGS: The cardiomediastinal silhouette is unremarkable. There is no evidence of focal airspace disease, pulmonary edema, suspicious pulmonary nodule/mass, pleural effusion, or pneumothorax. No acute bony abnormalities are identified. IMPRESSION: No active cardiopulmonary disease. Electronically Signed   By: Harmon Pier M.D.   On: 03/11/2016 16:36  " I personally reviewed the radiologic studies    ED Course:  Patient received IV Solu-Medrol and 2 separate duo nebs. Reexamination shows clearing of all wheezing and her pulse ox is 100% and she feels symptomatically improved. She states no chest tightness at this time. Patient's going to be discharged with prescription for continued steroid therapy and was given a prescription for an albuterol rescue inhaler.     Assessment: Acute exacerbation of chronic asthma     Plan:  Outpatient Patient was advised to return immediately if condition worsens. Patient was advised to follow up with their primary care physician or other specialized physicians involved in their outpatient care. The patient and/or family member/power of attorney had laboratory results reviewed at the bedside. All questions and concerns were addressed and appropriate discharge instructions were distributed by the nursing staff.            Jennye Moccasin, MD 03/11/16 [phone]

## 2016-03-11 NOTE — Discharge Instructions (Signed)
Please return immediately if condition worsens. Please contact her primary physician or the physician you were given for referral. If you have any specialist physicians involved in her treatment and plan please also contact them. Thank you for using [place] [facility] emergency [facility]. ° °

## 2016-08-09 ENCOUNTER — Telehealth: Payer: Self-pay | Admitting: Certified Nurse Midwife

## 2016-08-09 NOTE — Telephone Encounter (Signed)
Patient Katrina [name] wanting to schedule an appointment and she also ha some questions. I called the patient back and the patient sated that she was currently at work, and that she will call back at a later time to schedule an appointment. Thank you.

## 2016-08-21 ENCOUNTER — Encounter: Payer: Self-pay | Admitting: Emergency Medicine

## 2016-08-21 ENCOUNTER — Emergency Department: Payer: Self-pay

## 2016-08-21 ENCOUNTER — Emergency Department
Admission: EM | Admit: 2016-08-21 | Discharge: 2016-08-21 | Disposition: A | Discharge status: Home / Self Care | Payer: Self-pay | Attending: Emergency Medicine | Admitting: Emergency Medicine

## 2016-08-21 DIAGNOSIS — O200 Threatened abortion: Secondary | ICD-10-CM | POA: Insufficient documentation

## 2016-08-21 DIAGNOSIS — Z79899 Other long term (current) drug therapy: Secondary | ICD-10-CM | POA: Insufficient documentation

## 2016-08-21 DIAGNOSIS — Z87891 Personal history of nicotine dependence: Secondary | ICD-10-CM | POA: Insufficient documentation

## 2016-08-21 DIAGNOSIS — J45909 Unspecified asthma, uncomplicated: Secondary | ICD-10-CM | POA: Insufficient documentation

## 2016-08-21 DIAGNOSIS — O210 Mild hyperemesis gravidarum: Secondary | ICD-10-CM | POA: Insufficient documentation

## 2016-08-21 DIAGNOSIS — R102 Pelvic and perineal pain: Secondary | ICD-10-CM | POA: Insufficient documentation

## 2016-08-21 DIAGNOSIS — Z3A08 8 weeks gestation of pregnancy: Secondary | ICD-10-CM | POA: Insufficient documentation

## 2016-08-21 LAB — COMPREHENSIVE METABOLIC PANEL
ALBUMIN: 4.3 g/dL (ref 3.5–5.0)
ALT: 24 U/L (ref 14–54)
ANION GAP: 8 (ref 5–15)
AST: 20 U/L (ref 15–41)
Alkaline Phosphatase: 54 U/L (ref 38–126)
BUN: 8 mg/dL (ref 6–20)
CO2: 24 mmol/L (ref 22–32)
Calcium: 9.3 mg/dL (ref 8.9–10.3)
Chloride: 104 mmol/L (ref 101–111)
Creatinine, Ser: 0.52 mg/dL (ref 0.44–1.00)
GFR calc Af Amer: >60 mL/min (ref >60)
GFR calc non Af Amer: >60 mL/min (ref >60)
GLUCOSE: 85 mg/dL (ref 65–99)
POTASSIUM: 4 mmol/L (ref 3.5–5.1)
Sodium: 136 mmol/L (ref 135–145)
Total Bilirubin: 0.5 mg/dL (ref 0.3–1.2)
Total Protein: 7.6 g/dL (ref 6.5–8.1)

## 2016-08-21 LAB — URINALYSIS, COMPLETE (UACMP) WITH MICROSCOPIC
BILIRUBIN URINE: NEGATIVE
GLUCOSE, UA: NEGATIVE mg/dL
KETONES UR: 80 mg/dL — AB
Nitrite: NEGATIVE
PH: 5 (ref 5.0–8.0)
Protein, ur: NEGATIVE mg/dL
Specific Gravity, Urine: 1.023 (ref 1.005–1.030)

## 2016-08-21 LAB — WET PREP, GENITAL
CLUE CELLS WET PREP: NONE SEEN
SPERM: NONE SEEN
TRICH WET PREP: NONE SEEN
Yeast Wet Prep HPF POC: NONE SEEN

## 2016-08-21 LAB — CBC
HEMATOCRIT: 40.1 % (ref 35.0–47.0)
HEMOGLOBIN: 14.3 g/dL (ref 12.0–16.0)
MCH: 30.8 pg (ref 26.0–34.0)
MCHC: 35.7 g/dL (ref 32.0–36.0)
MCV: 86.3 fL (ref 80.0–100.0)
Platelets: 224 10*3/uL (ref 150–440)
RBC: 4.64 MIL/uL (ref 3.80–5.20)
RDW: 13.5 % (ref 11.5–14.5)
WBC: 10.4 10*3/uL (ref 3.6–11.0)

## 2016-08-21 LAB — CHLAMYDIA/NGC RT PCR (ARMC ONLY)
Chlamydia Tr: NOT DETECTED
N gonorrhoeae: NOT DETECTED

## 2016-08-21 LAB — POCT PREGNANCY, URINE: Preg Test, Ur: POSITIVE — AB

## 2016-08-21 LAB — HCG, QUANTITATIVE, PREGNANCY: hCG, Beta Chain, Quant, S: 318560 m[IU]/mL — ABNORMAL HIGH (ref <5)

## 2016-08-21 LAB — ABO/RH: ABO/RH(D): A POS

## 2016-08-21 MED ORDER — SODIUM CHLORIDE 0.9 % IV BOLUS (SEPSIS)
1000.0000 mL | Freq: Once | INTRAVENOUS | Status: AC
  Administered 2016-08-21: 1000 mL via INTRAVENOUS

## 2016-08-21 MED ORDER — PROMETHAZINE HCL 12.5 MG PO TABS: 12.5000 mg | ORAL_TABLET | Freq: Four times a day (QID) | ORAL | 0 refills | Status: DC | PRN

## 2016-08-21 MED ORDER — ONDANSETRON 4 MG PO TBDP
4.0000 mg | ORAL_TABLET | Freq: Once | ORAL | Status: AC
  Administered 2016-08-21: 4 mg via ORAL
  Filled 2016-08-21: qty 1

## 2016-08-21 MED ORDER — DOXYLAMINE-PYRIDOXINE 10-10 MG PO TBEC: DELAYED_RELEASE_TABLET | ORAL | 1 refills | Status: DC

## 2016-08-21 NOTE — ED Provider Notes (Signed)
[facility] Emergency Department Provider Note  ____________________________________________   I have reviewed the triage vital signs and the nursing notes.   HISTORY  Chief Complaint Abdominal Pain and Emesis During Pregnancy    HPI Katrina [name] is a 21 y.o. female who is mostly a weeks by date, she is G3 P1 with one prior miscarriage, she has not yet seen an OB for this and she is otherwise in good health aside from history of asthma. She has been having hyperemesis for the last 2-3 weeks. She vomited several times a day but is able to hold down fluids. She has been prescribed Phenergan but has not been taking it because she finds it not to be helpful. She denies any dysuria or urinary frequency. The patient states that she had some chocolate colored brown discharge which was thought to be blood yesterday and a little bit today. No frank vaginal bleeding. She is also experiencing cramping in her lower pelvic region since yesterday. On both sides.     Past Medical History:  Diagnosis Date  . Asthma     Patient Active Problem List   Diagnosis Date Noted  . Indication for care in labor or delivery 09/02/2014  . Lumbago 09/01/2014    History reviewed. No pertinent surgical history.  Prior to Admission medications   Medication Sig Start Date End Date Taking? Authorizing Provider  albuterol (PROVENTIL HFA;VENTOLIN HFA) 108 (90 Base) MCG/ACT inhaler Inhale 2 puffs into the lungs every 6 (six) hours as needed for wheezing or shortness of breath. 03/11/16   Jennye Moccasin, MD  HYDROcodone-acetaminophen (NORCO/VICODIN) 5-325 MG tablet Take 1 tablet by mouth every 4 (four) hours as needed. 11/06/15   Minna Antis, MD  ibuprofen (ADVIL,MOTRIN) 600 MG tablet Take 1 tablet (600 mg total) by mouth every 6 (six) hours as needed for mild pain or moderate pain. 09/05/14   Nadara Mustard, MD  predniSONE (DELTASONE) 20 MG tablet Take 2 tablets (40 mg total) by  mouth daily. 03/12/16   Jennye Moccasin, MD  Prenatal Vit-Fe Fumarate-FA (PRENATAL MULTIVITAMIN) TABS tablet Take 1 tablet by mouth daily at 12 noon. 09/05/14   Nadara Mustard, MD    Allergies Patient has no known allergies.  No family history on file.  Social History Social History  Substance Use Topics  . Smoking status: Former Games developer  . Smokeless tobacco: Never Used  . Alcohol use No    Review of Systems Constitutional: No fever/chills Eyes: No visual changes. ENT: No sore throat. No stiff neck no neck pain Cardiovascular: Denies chest pain. Respiratory: Denies shortness of breath. Gastrointestinal:   Positive vomiting.  No diarrhea.  No constipation. Genitourinary: Negative for dysuria. Musculoskeletal: Negative lower extremity swelling Skin: Negative for rash. Neurological: Negative for severe headaches, focal weakness or numbness.   ____________________________________________   PHYSICAL EXAM:  VITAL SIGNS: ED Triage Vitals  Enc Vitals Group     BP 08/21/16 1952 125/65     Pulse Rate 08/21/16 1952 95     Resp 08/21/16 1952 18     Temp 08/21/16 1952 98.2 F (36.8 C)     Temp Source 08/21/16 1952 Oral     SpO2 08/21/16 1952 98 %     Weight 08/21/16 1952 130 lb (59 kg)     Height 08/21/16 1952 4\' 10"  (1.473 m)     Head Circumference --      Peak Flow --      Pain Score 08/21/16 1953  5     Pain Loc --      Pain Edu? --      Excl. in GC? --     Constitutional: Alert and oriented. Well appearing and in no acute distress. Eyes: Conjunctivae are normal Head: Atraumatic HEENT: No congestion/rhinnorhea. Mucous membranes are Slightly dry.  Oropharynx non-erythematous Neck:   Nontender with no meningismus, no masses, no stridor Cardiovascular: Normal rate, regular rhythm. Grossly normal heart sounds.  Good peripheral circulation. Respiratory: Normal respiratory effort.  No retractions. Lungs CTAB. Abdominal: Soft and nontender. No distention. No guarding no  rebound Back:  There is no focal tenderness or step off.  there is no midline tenderness there are no lesions noted. there is no CVA tenderness Pelvic exam: Female nurse chaperone present, no external lesions noted, physiologic vaginal discharge noted with no purulent discharge, no cervical motion tenderness, no adnexal tenderness or mass, there is mild uterine tenderness but no mass. No vaginal bleeding Musculoskeletal: No lower extremity tenderness, no upper extremity tenderness. No joint effusions, no DVT signs strong distal pulses no edema Neurologic:  Normal speech and language. No gross focal neurologic deficits are appreciated.  Skin:  Skin is warm, dry and intact. No rash noted. Psychiatric: Mood and affect are normal. Speech and behavior are normal.  ____________________________________________   LABS (all labs ordered are listed, but only abnormal results are displayed)  Labs Reviewed  URINALYSIS, COMPLETE (UACMP) WITH MICROSCOPIC - Abnormal; Notable for the following:       Result Value   Color, Urine YELLOW (*)    APPearance CLOUDY (*)    Hgb urine dipstick SMALL (*)    Ketones, ur 80 (*)    Leukocytes, UA SMALL (*)    Bacteria, UA RARE (*)    Squamous Epithelial / LPF 6-30 (*)    All other components within normal limits  POCT PREGNANCY, URINE - Abnormal; Notable for the following:    Preg Test, Ur POSITIVE (*)    All other components within normal limits  CHLAMYDIA/NGC RT PCR ([facility] ONLY)  WET PREP, GENITAL  COMPREHENSIVE METABOLIC PANEL  CBC  HCG, QUANTITATIVE, PREGNANCY  POC URINE PREG, ED  ABO/RH   ____________________________________________  EKG  I personally interpreted any EKGs ordered by me or triage  ____________________________________________  RADIOLOGY  I reviewed any imaging ordered by me or triage that were performed during my shift and, if possible, patient and/or family made aware of any abnormal  findings. ____________________________________________   PROCEDURES  Procedure(s) performed: None  Procedures  Critical Care performed: None  ____________________________________________   INITIAL IMPRESSION / ASSESSMENT AND PLAN / ED COURSE  Pertinent labs & imaging results that were available during my care of the patient were reviewed by me and considered in my medical decision making (see chart for details).  Patient with pelvic cramping, mild spotting, and hyperemesis. Very well-appearing. We will obtain ultrasound to rule out ectopic low suspicion. No real lateralizing signs. No white count, or lateralizing signs to suggest that the patient has other abdominal pathology causing her to vomit for the last several weeks such as appendicitis or gallbladder disease. We will continue to hydrate the patient. She is feeling much better after Zofran prior to coming back. She does have ketosis but no other significant evidence of dehydration such as renal failure. We will send her home if ultrasound is reassuring with diclegis and close outpatient follow-up with OB/GYN. Patient is a positive. Patient and family are very much in agreement with this plan.  Low suspicion for PID    ____________________________________________   FINAL CLINICAL IMPRESSION(S) / ED DIAGNOSES  Final diagnoses:  None      This chart was dictated using voice recognition software.  Despite best efforts to proofread,  errors can occur which can change meaning.      Jeanmarie Plant, MD 08/21/16 2151

## 2016-08-21 NOTE — ED Triage Notes (Signed)
Pt presents to ED with c/o right sided lower abd pain since this morning and frequent vomiting for the past several weeks. Currently [redacted] weeks pregnant. Pt was seen by health dept for confirmation of pregnancy but has not been seen by her chosen obgyn. Pt states she has a hx of hyperemesis gravidarum with previous pregnancy and states feels like it is worse this time. Denies increase in abd pain with palpation. Reports some dark brown/red spotting yesterday but denies any today.

## 2016-08-24 ENCOUNTER — Telehealth: Payer: Self-pay | Admitting: Emergency Medicine

## 2016-08-24 LAB — URINE CULTURE: Culture: 4000 — AB

## 2016-08-24 NOTE — Telephone Encounter (Addendum)
Called patient to inform of urine culture and discuss her follow up plans.  Left message asking her to call me.   Pt called me back.  Explained the culture--group b strep.  She does plan to do prenatal care at [facility], but is waiting for her medicaid.

## 2016-09-10 ENCOUNTER — Emergency Department
Admission: EM | Admit: 2016-09-10 | Discharge: 2016-09-10 | Disposition: A | Discharge status: Home / Self Care | Payer: Medicaid Other | Attending: Emergency Medicine | Admitting: Emergency Medicine

## 2016-09-10 DIAGNOSIS — Z87891 Personal history of nicotine dependence: Secondary | ICD-10-CM | POA: Insufficient documentation

## 2016-09-10 DIAGNOSIS — J45909 Unspecified asthma, uncomplicated: Secondary | ICD-10-CM | POA: Diagnosis not present

## 2016-09-10 DIAGNOSIS — L409 Psoriasis, unspecified: Secondary | ICD-10-CM | POA: Insufficient documentation

## 2016-09-10 DIAGNOSIS — O210 Mild hyperemesis gravidarum: Secondary | ICD-10-CM

## 2016-09-10 DIAGNOSIS — Z79899 Other long term (current) drug therapy: Secondary | ICD-10-CM | POA: Diagnosis not present

## 2016-09-10 DIAGNOSIS — Z3A11 11 weeks gestation of pregnancy: Secondary | ICD-10-CM | POA: Diagnosis not present

## 2016-09-10 LAB — URINALYSIS, COMPLETE (UACMP) WITH MICROSCOPIC
Bacteria, UA: NONE SEEN
Bilirubin Urine: NEGATIVE
GLUCOSE, UA: NEGATIVE mg/dL
Hgb urine dipstick: NEGATIVE
KETONES UR: 80 mg/dL — AB
Leukocytes, UA: NEGATIVE
Nitrite: NEGATIVE
PH: 6 (ref 5.0–8.0)
Protein, ur: NEGATIVE mg/dL
SPECIFIC GRAVITY, URINE: 1.021 (ref 1.005–1.030)

## 2016-09-10 LAB — COMPREHENSIVE METABOLIC PANEL
ALBUMIN: 3.7 g/dL (ref 3.5–5.0)
ALT: 28 U/L (ref 14–54)
AST: 20 U/L (ref 15–41)
Alkaline Phosphatase: 48 U/L (ref 38–126)
Anion gap: 7 (ref 5–15)
BUN: <5 mg/dL — ABNORMAL LOW (ref 6–20)
CHLORIDE: 107 mmol/L (ref 101–111)
CO2: 22 mmol/L (ref 22–32)
Calcium: 8.7 mg/dL — ABNORMAL LOW (ref 8.9–10.3)
Creatinine, Ser: 0.47 mg/dL (ref 0.44–1.00)
GFR calc non Af Amer: >60 mL/min (ref >60)
GLUCOSE: 79 mg/dL (ref 65–99)
Potassium: 3.6 mmol/L (ref 3.5–5.1)
SODIUM: 136 mmol/L (ref 135–145)
Total Bilirubin: 0.7 mg/dL (ref 0.3–1.2)
Total Protein: 6.7 g/dL (ref 6.5–8.1)

## 2016-09-10 LAB — CBC WITH DIFFERENTIAL/PLATELET
Basophils Absolute: 0 10*3/uL (ref 0–0.1)
Basophils Relative: 1 %
Eosinophils Absolute: 0.6 10*3/uL (ref 0–0.7)
Eosinophils Relative: 8 %
HCT: 36.9 % (ref 35.0–47.0)
HEMOGLOBIN: 13.2 g/dL (ref 12.0–16.0)
LYMPHS ABS: 1.8 10*3/uL (ref 1.0–3.6)
LYMPHS PCT: 23 %
MCH: 30.6 pg (ref 26.0–34.0)
MCHC: 35.8 g/dL (ref 32.0–36.0)
MCV: 85.7 fL (ref 80.0–100.0)
MONO ABS: 0.5 10*3/uL (ref 0.2–0.9)
MONOS PCT: 6 %
NEUTROS ABS: 4.8 10*3/uL (ref 1.4–6.5)
NEUTROS PCT: 62 %
PLATELETS: 218 10*3/uL (ref 150–440)
RBC: 4.31 MIL/uL (ref 3.80–5.20)
RDW: 12.9 % (ref 11.5–14.5)
WBC: 7.8 10*3/uL (ref 3.6–11.0)

## 2016-09-10 LAB — BETA-HYDROXYBUTYRIC ACID: BETA-HYDROXYBUTYRIC ACID: 0.89 mmol/L — AB (ref 0.05–0.27)

## 2016-09-10 MED ORDER — METOCLOPRAMIDE HCL 10 MG PO TABS: 10.0000 mg | ORAL_TABLET | Freq: Three times a day (TID) | ORAL | 0 refills | Status: DC

## 2016-09-10 MED ORDER — DEXTROSE 5 % AND 0.9 % NACL IV BOLUS
1000.0000 mL | Freq: Once | INTRAVENOUS | Status: AC
  Administered 2016-09-10: 1000 mL via INTRAVENOUS
  Filled 2016-09-10: qty 1000

## 2016-09-10 MED ORDER — METOCLOPRAMIDE HCL 5 MG/ML IJ SOLN
10.0000 mg | Freq: Once | INTRAMUSCULAR | Status: AC
  Administered 2016-09-10: 10 mg via INTRAVENOUS
  Filled 2016-09-10: qty 2

## 2016-09-10 MED ORDER — HYDROCORTISONE 1 % EX CREA
TOPICAL_CREAM | Freq: Once | CUTANEOUS | Status: AC
  Administered 2016-09-10: 1 via TOPICAL
  Filled 2016-09-10: qty 28

## 2016-09-10 NOTE — ED Triage Notes (Addendum)
Pt c/o emesis during pregnancy. Pt approx 11 weeks. Pt has not eaten since last night per report. Pt states that she was prescribed phenergan and diclegis last time she was here. Only had phenergan filled. Pt alert and oriented X4, active, cooperative, pt in NAD. RR even and unlabored, color WNL.  No bleeding or abdominal pain.

## 2016-09-10 NOTE — ED Notes (Signed)
Patient states she has felt dizzy and light headed today.  Patient reports not being able to eat or drink since 8 pm yesterday due to hyperemesis during pregnancy.  Patient states she has not yet had a prenatal care appointment due to insurance issues.  Patient is in no obvious distress at this time.  Denies abdominal pain and vaginal bleeding.

## 2016-09-10 NOTE — ED Provider Notes (Signed)
[facility] Emergency Department Provider Note  ____________________________________________   First MD Initiated Contact with Patient 09/10/16 1534     (approximate)  I have reviewed the triage vital signs and the nursing notes.   HISTORY  Chief Complaint Emesis During Pregnancy    HPI Inaara Argie Lober is a 21 y.o. female who comes to the emergency department with nausea and vomiting. She is roughly [redacted] weeks pregnant. She is G3 P1 with one previous miscarriage. She had severe morning sickness with her previous pregnancy although at that time it responded well to Phenergan. She has not yet established care with an OB gynecologist during this pregnancy. She was previously prescribed Phenergan and diclyges in this pregnancy however neither have been effective.she comes to the emergency department today because everything she tried to eat or drink she vomited back up. She has moderate severity cramping and nonradiating abdominal discomfort improved after vomiting worse when trying to vomit.   Past Medical History:  Diagnosis Date  . Asthma     Patient Active Problem List   Diagnosis Date Noted  . Indication for care in labor or delivery 09/02/2014  . Lumbago 09/01/2014    History reviewed. No pertinent surgical history.  Prior to Admission medications   Medication Sig Start Date End Date Taking? Authorizing Provider  albuterol (PROVENTIL HFA;VENTOLIN HFA) 108 (90 Base) MCG/ACT inhaler Inhale 2 puffs into the lungs every 6 (six) hours as needed for wheezing or shortness of breath. 03/11/16   Jennye Moccasin, MD  Doxylamine-Pyridoxine 10-10 MG TBEC 2 tabs orally at hs (Day 1). If this works, continue taking 2 tab qhs. However, if nausea persists Day 2, take 2 tabs at bedtime that night then take three tablets starting on Day 3 (one tablet in the morning and two tablets at bedtime). If these 3 tablets control symptoms on Day 4, continue taking 3 tabs  daily. Otherwise take 4 tabs starting on Day 4 (one tablet in the morning, one tablet mid-afternoon and two tablets at bedtime).1 08/21/16   Jeanmarie Plant, MD  HYDROcodone-acetaminophen (NORCO/VICODIN) 5-325 MG tablet Take 1 tablet by mouth every 4 (four) hours as needed. 11/06/15   Minna Antis, MD  ibuprofen (ADVIL,MOTRIN) 600 MG tablet Take 1 tablet (600 mg total) by mouth every 6 (six) hours as needed for mild pain or moderate pain. 09/05/14   Nadara Mustard, MD  metoCLOPramide (REGLAN) 10 MG tablet Take 1 tablet (10 mg total) by mouth 3 (three) times daily with meals. 09/10/16 09/10/17  Merrily Brittle, MD  predniSONE (DELTASONE) 20 MG tablet Take 2 tablets (40 mg total) by mouth daily. 03/12/16   Jennye Moccasin, MD  Prenatal Vit-Fe Fumarate-FA (PRENATAL MULTIVITAMIN) TABS tablet Take 1 tablet by mouth daily at 12 noon. 09/05/14   Nadara Mustard, MD  promethazine (PHENERGAN) 12.5 MG tablet Take 1 tablet (12.5 mg total) by mouth every 6 (six) hours as needed for nausea or vomiting. 08/21/16   Jeanmarie Plant, MD    Allergies Patient has no known allergies.  No family history on file.  Social History Social History  Substance Use Topics  . Smoking status: Former Games developer  . Smokeless tobacco: Never Used  . Alcohol use No    Review of Systems Constitutional: No fever/chills Eyes: No visual changes. ENT: No sore throat. Cardiovascular: Denies chest pain. Respiratory: Denies shortness of breath. Gastrointestinal: positive abdominal pain.  Positive nausea, positive vomiting.  No diarrhea.  No constipation. Genitourinary: Negative for  dysuria. Musculoskeletal: Negative for back pain. Skin: Negative for rash. Neurological: Negative for headaches, focal weakness or numbness.   ____________________________________________   PHYSICAL EXAM:  VITAL SIGNS: ED Triage Vitals [09/10/16 1455]  Enc Vitals Group     BP 122/63     Pulse Rate 93     Resp 18     Temp 98.3 F (36.8 C)      Temp Source Oral     SpO2 100 %     Weight 130 lb (59 kg)     Height 4\' 10"  (1.473 m)     Head Circumference      Peak Flow      Pain Score 0     Pain Loc      Pain Edu?      Excl. in GC?     Constitutional: alert and oriented 4 appears uncomfortable sitting up holding an emesis bag Eyes: PERRL EOMI. Head: Atraumatic. Nose: No congestion/rhinnorhea. Mouth/Throat: No trismus Neck: No stridor.   Cardiovascular: Normal rate, regular rhythm. Grossly normal heart sounds.  Good peripheral circulation. Respiratory: Normal respiratory effort.  No retractions. Lungs CTAB and moving good air Gastrointestinal: soft nontender Musculoskeletal: No lower extremity edema   Neurologic:  Normal speech and language. No gross focal neurologic deficits are appreciated. Skin:  dorsal aspect of left elbow with psoriatic appearing lesion with erythematous base and scaling plaque on top Psychiatric: Mood and affect are normal. Speech and behavior are normal.    ____________________________________________   DIFFERENTIAL includes but not limited to  hyperemesis gravidarum, morning sickness, metabolic derangement, dehydration ____________________________________________   LABS (all labs ordered are listed, but only abnormal results are displayed)  Labs Reviewed  COMPREHENSIVE METABOLIC PANEL - Abnormal; Notable for the following:       Result Value   BUN <5 (*)    Calcium 8.7 (*)    All other components within normal limits  BETA-HYDROXYBUTYRIC ACID - Abnormal; Notable for the following:    Beta-Hydroxybutyric Acid 0.89 (*)    All other components within normal limits  CBC WITH DIFFERENTIAL/PLATELET  URINALYSIS, COMPLETE (UACMP) WITH MICROSCOPIC    blood work unremarkable aside from slightly elevated beta hydroxybutyric  acid __________________________________________  EKG   ____________________________________________  RADIOLOGY   ____________________________________________   PROCEDURES  Procedure(s) performed: no  Procedures  Critical Care performed: o  Observation: no ____________________________________________   INITIAL IMPRESSION / ASSESSMENT AND PLAN / ED COURSE  Pertinent labs & imaging results that were available during my care of the patient were reviewed by me and considered in my medical decision making (see chart for details).  The patient arrives clearly nauseated and uncomfortable appearing. She is currently [redacted] weeks pregnant. We will begin with labs including a beta hydroxybutyric acid as well as D5 normal saline and reglan.     ----------------------------------------- 5:48 PM on 09/10/2016 -----------------------------------------  The patient's symptoms are resolved and she is now able to eat and drink without difficulty. She'll be discharged with a short course of Reglan. ____________________________________________   FINAL CLINICAL IMPRESSION(S) / ED DIAGNOSES  Final diagnoses:  Psoriasis  Morning sickness      NEW MEDICATIONS STARTED DURING THIS VISIT:  New Prescriptions   METOCLOPRAMIDE (REGLAN) 10 MG TABLET    Take 1 tablet (10 mg total) by mouth 3 (three) times daily with meals.     Note:  This document was prepared using Dragon voice recognition software and may include unintentional dictation errors.     Merrily Brittleifenbark, Ronel Rodeheaver, MD 09/10/16  1751  

## 2016-09-10 NOTE — Discharge Instructions (Signed)
In addition to using Reglan as needed he can also take over-the-counter vitamin B6 up to 3 times a day as well as over-the-counter doxylamine (unisom) at night to help with your nausea.  Please use hydrocortisone cream 1% twice a day on the rash on your elbow.  Follow up with your OB as scheduled and return to the ED for any concerns.  It was a pleasure to take care of you today, and thank you for coming to our emergency department.  If you have any questions or concerns before leaving please ask the nurse to grab me and I'm more than happy to go through your aftercare instructions again.  If you were prescribed any opioid pain medication today such as Norco, Vicodin, Percocet, morphine, hydrocodone, or oxycodone please make sure you do not drive when you are taking this medication as it can alter your ability to drive safely.  If you have any concerns once you are home that you are not improving or are in fact getting worse before you can make it to your follow-up appointment, please do not hesitate to call 911 and come back for further evaluation.  Merrily BrittleNeil Tequilla Cousineau, MD  Results for orders placed or performed during the hospital encounter of 09/10/16  Comprehensive metabolic panel  Result Value Ref Range   Sodium 136 135 - 145 mmol/L   Potassium 3.6 3.5 - 5.1 mmol/L   Chloride 107 101 - 111 mmol/L   CO2 22 22 - 32 mmol/L   Glucose, Bld 79 65 - 99 mg/dL   BUN <5 (L) 6 - 20 mg/dL   Creatinine, Ser 8.290.47 0.44 - 1.00 mg/dL   Calcium 8.7 (L) 8.9 - 10.3 mg/dL   Total Protein 6.7 6.5 - 8.1 g/dL   Albumin 3.7 3.5 - 5.0 g/dL   AST 20 15 - 41 U/L   ALT 28 14 - 54 U/L   Alkaline Phosphatase 48 38 - 126 U/L   Total Bilirubin 0.7 0.3 - 1.2 mg/dL   GFR calc non Af Amer >60 >60 mL/min   GFR calc Af Amer >60 >60 mL/min   Anion gap 7 5 - 15  CBC with Differential  Result Value Ref Range   WBC 7.8 3.6 - 11.0 K/uL   RBC 4.31 3.80 - 5.20 MIL/uL   Hemoglobin 13.2 12.0 - 16.0 g/dL   HCT 56.236.9 13.035.0 - 86.547.0  %   MCV 85.7 80.0 - 100.0 fL   MCH 30.6 26.0 - 34.0 pg   MCHC 35.8 32.0 - 36.0 g/dL   RDW 78.412.9 69.611.5 - 29.514.5 %   Platelets 218 150 - 440 K/uL   Neutrophils Relative % 62 %   Neutro Abs 4.8 1.4 - 6.5 K/uL   Lymphocytes Relative 23 %   Lymphs Abs 1.8 1.0 - 3.6 K/uL   Monocytes Relative 6 %   Monocytes Absolute 0.5 0.2 - 0.9 K/uL   Eosinophils Relative 8 %   Eosinophils Absolute 0.6 0 - 0.7 K/uL   Basophils Relative 1 %   Basophils Absolute 0.0 0 - 0.1 K/uL  Beta-hydroxybutyric acid  Result Value Ref Range   Beta-Hydroxybutyric Acid 0.89 (H) 0.05 - 0.27 mmol/L   [place] Ob Comp Less 14 Wks  Result Date: 08/21/2016 CLINICAL DATA:  Pregnant patient in first-trimester pregnancy with pelvic pain and spotting. EXAM: OBSTETRIC <14 WK US AND TRANSVAGINAL OB US TECHNIQUE: Both transabdominal and transvaginal ultrasound examinations were performed for complete evaluation of the gestation as well as the maternal uterus,  adnexal regions, and pelvic cul-de-sac. Transvaginal technique was performed to assess early pregnancy. COMPARISON:  None. FINDINGS: Intrauterine gestational sac: Single Yolk sac:  Visualized. Embryo:  Visualized. Cardiac Activity: Visualized. Heart Rate: 180  bpm CRL:  20.6  mm   8 w   5 d                  [place] EDC: 03/28/2017 Subchorionic hemorrhage:  None visualized. Maternal uterus/adnexae: Both ovaries are visualized and normal, corpus luteal cyst in the right ovary. No adnexal mass. Trace pelvic free fluid. IMPRESSION: Single live intrauterine gestation estimated gestational age 489 weeks 5 days for estimated date of delivery 03/28/2017. No subchorionic hemorrhage. Electronically Signed   By: Rubye Oaks M.D.   On: 08/21/2016 22:18   US Ob Transvaginal  Result Date: 08/21/2016 CLINICAL DATA:  Pregnant patient in first-trimester pregnancy with pelvic pain and spotting. EXAM: OBSTETRIC <14 WK [place] AND TRANSVAGINAL OB US TECHNIQUE: Both transabdominal and transvaginal ultrasound examinations  were performed for complete evaluation of the gestation as well as the maternal uterus, adnexal regions, and pelvic cul-de-sac. Transvaginal technique was performed to assess early pregnancy. COMPARISON:  None. FINDINGS: Intrauterine gestational sac: Single Yolk sac:  Visualized. Embryo:  Visualized. Cardiac Activity: Visualized. Heart Rate: 180  bpm CRL:  20.6  mm   8 w   5 d                  [place] EDC: 03/28/2017 Subchorionic hemorrhage:  None visualized. Maternal uterus/adnexae: Both ovaries are visualized and normal, corpus luteal cyst in the right ovary. No adnexal mass. Trace pelvic free fluid. IMPRESSION: Single live intrauterine gestation estimated gestational age 489 weeks 5 days for estimated date of delivery 03/28/2017. No subchorionic hemorrhage. Electronically Signed   By: Rubye Oaks M.D.   On: 08/21/2016 22:18

## 2016-09-24 ENCOUNTER — Encounter: Payer: Self-pay | Admitting: Obstetrics and Gynecology

## 2016-09-24 ENCOUNTER — Ambulatory Visit (INDEPENDENT_AMBULATORY_CARE_PROVIDER_SITE_OTHER): Payer: Self-pay | Admitting: Obstetrics and Gynecology

## 2016-09-24 VITALS — BP 96/59 | HR 68 | Wt 115.5 lb

## 2016-09-24 DIAGNOSIS — Z3482 Encounter for supervision of other normal pregnancy, second trimester: Secondary | ICD-10-CM

## 2016-09-24 LAB — POCT URINALYSIS DIPSTICK
BILIRUBIN UA: NEGATIVE
GLUCOSE UA: NEGATIVE
KETONES UA: NEGATIVE
Leukocytes, UA: NEGATIVE
NITRITE UA: NEGATIVE
Protein, UA: NEGATIVE
RBC UA: NEGATIVE
SPEC GRAV UA: 1.025 (ref 1.010–1.025)
Urobilinogen, UA: 0.2 E.U./dL
pH, UA: 5 (ref 5.0–8.0)

## 2016-09-24 NOTE — Progress Notes (Signed)
NOB:  She presented today for her new OB physical. Her pregnancy was [facility]. She had an ultrasound at 8 weeks for dating and pelvic pain-her pain has resolved. Last menses was an uncomplicated term vaginal delivery. This procedure has been complicated by significant nausea and vomiting requiring hospital admission twice so far. She has now begun to eat and drink and has gained 4 pounds in the last 2 weeks. She still uses occasional Phenergan as the only medication that has helped. Pap done today - MaterniT21 today.  Desires AFP at next visit. Physical examination General NAD, Conversant  HEENT Atraumatic; Op clear with mmm.  Normo-cephalic. Pupils reactive. Anicteric sclerae  Thyroid/Neck Smooth without nodularity or enlargement. Normal ROM.  Neck Supple.  Skin No rashes, lesions or ulceration. Normal palpated skin turgor. No nodularity.  Breasts: No masses or discharge.  Symmetric.  No axillary adenopathy.  Lungs: Clear to auscultation.No rales or wheezes. Normal Respiratory effort, no retractions.  Heart: NSR.  No murmurs or rubs appreciated. No periferal edema  Abdomen: Soft.  Non-tender.  No masses.  No HSM. No hernia  Extremities: Moves all appropriately.  Normal ROM for age. No lymphadenopathy.  Neuro: Oriented to PPT.  Normal mood. Normal affect.     Pelvic:   Vulva: Normal appearance.  No lesions.  Vagina: No lesions or abnormalities noted.  Support: Normal pelvic support.  Urethra No masses tenderness or scarring.  Meatus Normal size without lesions or prolapse.  Cervix: Normal appearance.  No lesions.  Anus: Normal exam.  No lesions.  Perineum: Normal exam.  No lesions.        Bimanual   Adnexae: No masses.  Non-tender to palpation.  Uterus: Enlarged. 14 wks  Non-tender.  Mobile.  AV.  Adnexae: No masses.  Non-tender to palpation.  Cul-de-sac: Negative for abnormality.  Adnexae: No masses.  Non-tender to palpation.         Pelvimetry    Diagonal: Reached.  Spines: Average.  Sacrum: Concave.  Pubic Arch: Normal.

## 2016-09-25 ENCOUNTER — Other Ambulatory Visit: Payer: Self-pay

## 2016-09-25 LAB — PAP IG W/ RFLX HPV ASCU: PAP Smear Comment: 0

## 2016-09-26 ENCOUNTER — Telehealth: Payer: Self-pay

## 2016-09-26 LAB — MONITOR DRUG PROFILE 14(MW)
Amphetamine Scrn, Ur: NEGATIVE ng/mL
BARBITURATE SCREEN URINE: NEGATIVE ng/mL
BENZODIAZEPINE SCREEN, URINE: NEGATIVE ng/mL
Buprenorphine, Urine: NEGATIVE ng/mL
CANNABINOIDS UR QL SCN: NEGATIVE ng/mL
CREATININE(CRT), U: 180.8 mg/dL (ref 20.0–300.0)
Cocaine (Metab) Scrn, Ur: NEGATIVE ng/mL
Fentanyl, Urine: NEGATIVE pg/mL
METHADONE SCREEN, URINE: NEGATIVE ng/mL
Meperidine Screen, Urine: NEGATIVE ng/mL
OXYCODONE+OXYMORPHONE UR QL SCN: NEGATIVE ng/mL
Opiate Scrn, Ur: NEGATIVE ng/mL
PH UR, DRUG SCRN: 8.1 (ref 4.5–8.9)
Phencyclidine Qn, Ur: NEGATIVE ng/mL
Propoxyphene Scrn, Ur: NEGATIVE ng/mL
SPECIFIC GRAVITY: 1.021
TRAMADOL SCREEN, URINE: NEGATIVE ng/mL

## 2016-09-26 LAB — URINE CULTURE

## 2016-09-26 LAB — URINALYSIS, ROUTINE W REFLEX MICROSCOPIC
BILIRUBIN UA: NEGATIVE
Glucose, UA: NEGATIVE
KETONES UA: NEGATIVE
Leukocytes, UA: NEGATIVE
NITRITE UA: NEGATIVE
Protein, UA: NEGATIVE
RBC UA: NEGATIVE
SPEC GRAV UA: 1.021 (ref 1.005–1.030)
UUROB: 1 mg/dL (ref 0.2–1.0)
pH, UA: 8.5 — ABNORMAL HIGH (ref 5.0–7.5)

## 2016-09-26 LAB — TOXOPLASMA ANTIBODIES- IGG AND  IGM
Toxoplasma Antibody- IgM: <3 AU/mL (ref 0.0–7.9)
Toxoplasma IgG Ratio: <3 IU/mL (ref 0.0–7.1)

## 2016-09-26 LAB — NICOTINE SCREEN, URINE: COTININE UR QL SCN: NEGATIVE ng/mL

## 2016-09-26 LAB — RPR: RPR Ser Ql: NONREACTIVE

## 2016-09-26 LAB — HIV ANTIBODY (ROUTINE TESTING W REFLEX): HIV SCREEN 4TH GENERATION: NONREACTIVE

## 2016-09-26 LAB — RUBELLA SCREEN: Rubella Antibodies, IGG: 1.51 index (ref >0.99)

## 2016-09-26 LAB — ANTIBODY SCREEN: Antibody Screen: NEGATIVE

## 2016-09-26 LAB — VARICELLA ZOSTER ANTIBODY, IGG: VARICELLA: 535 {index} (ref >165)

## 2016-09-26 MED ORDER — NITROFURANTOIN MONOHYD MACRO 100 MG PO CAPS: 100.0000 mg | ORAL_CAPSULE | Freq: Two times a day (BID) | ORAL | 0 refills | Status: DC

## 2016-09-26 NOTE — Telephone Encounter (Signed)
Pt informed and uses [facility]

## 2016-09-29 LAB — MATERNIT 21 PLUS CORE, BLOOD
Chromosome 13: NEGATIVE
Chromosome 18: NEGATIVE
Chromosome 21: NEGATIVE
Y Chromosome: DETECTED

## 2016-10-02 ENCOUNTER — Telehealth: Payer: Self-pay

## 2016-10-02 NOTE — Telephone Encounter (Signed)
Informed pt of negative results per provider. Pt requested gender- informed her its a boy.

## 2016-10-02 NOTE — Telephone Encounter (Signed)
-----   Message from Linzie Collin, MD sent at 10/02/2016  3:54 PM EDT ----- Fetal Screening test is negative for abnormalities.

## 2016-10-22 ENCOUNTER — Encounter: Payer: Self-pay | Admitting: Obstetrics and Gynecology

## 2016-10-22 ENCOUNTER — Ambulatory Visit (INDEPENDENT_AMBULATORY_CARE_PROVIDER_SITE_OTHER): Payer: Self-pay | Admitting: Obstetrics and Gynecology

## 2016-10-22 VITALS — BP 90/53 | HR 101 | Wt 120.6 lb

## 2016-10-22 DIAGNOSIS — R21 Rash and other nonspecific skin eruption: Secondary | ICD-10-CM

## 2016-10-22 DIAGNOSIS — Z3482 Encounter for supervision of other normal pregnancy, second trimester: Secondary | ICD-10-CM

## 2016-10-22 DIAGNOSIS — L299 Pruritus, unspecified: Secondary | ICD-10-CM

## 2016-10-22 DIAGNOSIS — Z23 Encounter for immunization: Secondary | ICD-10-CM

## 2016-10-22 LAB — POCT URINALYSIS DIPSTICK
Bilirubin, UA: NEGATIVE
Glucose, UA: NEGATIVE
Ketones, UA: NEGATIVE
Leukocytes, UA: NEGATIVE
NITRITE UA: NEGATIVE
PH UA: 7.5 (ref 5.0–8.0)
Protein, UA: NEGATIVE
RBC UA: NEGATIVE
UROBILINOGEN UA: 0.2 U/dL

## 2016-10-22 MED ORDER — PERMETHRIN 5 % EX CREA: TOPICAL_CREAM | Freq: Once | CUTANEOUS | Status: DC

## 2016-10-22 NOTE — Addendum Note (Signed)
Addended by: Garfield CorneaMABRY, JASMINE L on: 10/22/2016 10:37 AM   Modules accepted: Orders

## 2016-10-22 NOTE — Addendum Note (Signed)
Addended by: Garfield CorneaMABRY, Gabrial Domine L on: 10/22/2016 11:00 AM   Modules accepted: Orders

## 2016-10-22 NOTE — Progress Notes (Signed)
ROB- pt does states she has some cramping usually in the morning but then subsides, pt is concerned about some body itching on arms and legs denies rash, would like flu shot today

## 2016-10-22 NOTE — Progress Notes (Signed)
ROB: Complains of forearms and legs itching especially at night.  Rash with punctations noted, possibly scabies. Patient desires treatment.  Flu shot today.  AFP today.

## 2016-10-23 LAB — AFP TUMOR MARKER: AFP, Serum, Tumor Marker: 95.8 ng/mL — ABNORMAL HIGH (ref 0.0–8.3)

## 2016-10-31 LAB — AFP, SERUM, OPEN SPINA BIFIDA
AFP VALUE AFPOSL: 75.8 ng/mL
Maternal Age At EDD: 22.2 yr

## 2016-10-31 LAB — SPECIMEN STATUS REPORT

## 2016-11-08 ENCOUNTER — Ambulatory Visit (INDEPENDENT_AMBULATORY_CARE_PROVIDER_SITE_OTHER): Payer: Self-pay

## 2016-11-08 DIAGNOSIS — Z3482 Encounter for supervision of other normal pregnancy, second trimester: Secondary | ICD-10-CM

## 2016-11-20 ENCOUNTER — Ambulatory Visit (INDEPENDENT_AMBULATORY_CARE_PROVIDER_SITE_OTHER): Payer: Self-pay | Admitting: Obstetrics and Gynecology

## 2016-11-20 ENCOUNTER — Encounter: Payer: Self-pay | Admitting: Obstetrics and Gynecology

## 2016-11-20 VITALS — BP 104/67 | HR 78 | Wt 120.1 lb

## 2016-11-20 DIAGNOSIS — Z1379 Encounter for other screening for genetic and chromosomal anomalies: Secondary | ICD-10-CM

## 2016-11-20 DIAGNOSIS — Z3482 Encounter for supervision of other normal pregnancy, second trimester: Secondary | ICD-10-CM

## 2016-11-20 DIAGNOSIS — O2612 Low weight gain in pregnancy, second trimester: Secondary | ICD-10-CM

## 2016-11-20 LAB — POCT URINALYSIS DIPSTICK
Bilirubin, UA: NEGATIVE
Glucose, UA: NEGATIVE
Leukocytes, UA: NEGATIVE
Nitrite, UA: NEGATIVE
PROTEIN UA: NEGATIVE
RBC UA: NEGATIVE
SPEC GRAV UA: 1.015 (ref 1.010–1.025)
UROBILINOGEN UA: 0.2 U/dL
pH, UA: 7.5 (ref 5.0–8.0)

## 2016-11-20 NOTE — Progress Notes (Signed)
ROB: C/o vaginal discharge with itching, is self-treating with Monistat which is helping symptoms. Ketonuria noted in urine, notes that appetite is finally improving (was having lots of nausea).  To repeat AFP for spina bifida screen today (incorrect one ordered last visit).  RTC in 4 weeks for OB visit, and 6 weeks for repeat scan for LLP.

## 2016-11-20 NOTE — Progress Notes (Signed)
ROB- Pt states she is doing well, Pt has been taking monistat 7 due to having vaginal discharge, has been taking for x 2 days, sx of vaginal itching, and discharge is improving

## 2016-11-24 LAB — AFP, SERUM, OPEN SPINA BIFIDA
AFP MOM: 1.67
AFP Value: 136.4 ng/mL
GEST. AGE ON COLLECTION DATE: 21.7 wk
Maternal Age At EDD: 22.2 yr
OSBR RISK 1 IN: 1757
TEST RESULTS AFP: NEGATIVE
Weight: 120 [lb_av]

## 2016-12-18 ENCOUNTER — Ambulatory Visit (INDEPENDENT_AMBULATORY_CARE_PROVIDER_SITE_OTHER): Payer: Self-pay | Admitting: Obstetrics and Gynecology

## 2016-12-18 ENCOUNTER — Other Ambulatory Visit: Payer: Self-pay | Admitting: Obstetrics and Gynecology

## 2016-12-18 ENCOUNTER — Encounter: Payer: Self-pay | Admitting: Obstetrics and Gynecology

## 2016-12-18 VITALS — BP 110/68 | HR 103 | Wt 125.0 lb

## 2016-12-18 DIAGNOSIS — Z130 Encounter for screening for diseases of the blood and blood-forming organs and certain disorders involving the immune mechanism: Secondary | ICD-10-CM

## 2016-12-18 DIAGNOSIS — O4442 Low lying placenta NOS or without hemorrhage, second trimester: Secondary | ICD-10-CM

## 2016-12-18 DIAGNOSIS — Z131 Encounter for screening for diabetes mellitus: Secondary | ICD-10-CM

## 2016-12-18 DIAGNOSIS — Z3482 Encounter for supervision of other normal pregnancy, second trimester: Secondary | ICD-10-CM

## 2016-12-18 DIAGNOSIS — R21 Rash and other nonspecific skin eruption: Secondary | ICD-10-CM

## 2016-12-18 LAB — POCT URINALYSIS DIPSTICK
BILIRUBIN UA: NEGATIVE
Blood, UA: NEGATIVE
Glucose, UA: NEGATIVE
KETONES UA: NEGATIVE
Leukocytes, UA: NEGATIVE
NITRITE UA: NEGATIVE
PH UA: 6.5 (ref 5.0–8.0)
PROTEIN UA: NEGATIVE
Spec Grav, UA: 1.015 (ref 1.010–1.025)
Urobilinogen, UA: 0.2 E.U./dL

## 2016-12-18 MED ORDER — PERMETHRIN 5 % EX CREA: TOPICAL_CREAM | Freq: Once | CUTANEOUS | Status: AC

## 2016-12-18 NOTE — Progress Notes (Signed)
ROB: Patient with severe itchy rash.  Getting worse.  Rash on arms legs abdomen.  Some diffuse erythema but mostly punctate areas.  Scabies? -Will treat with permethrin .  No one else at home symptomatic.  If this fails strongly consider immediate referral to dermatology. (Patient would also like bile acid salts drawn today)

## 2016-12-19 ENCOUNTER — Telehealth: Payer: Self-pay

## 2016-12-19 LAB — BILE ACIDS, TOTAL: BILE ACIDS TOTAL: 10.7 umol/L (ref 4.7–24.5)

## 2016-12-19 NOTE — Telephone Encounter (Signed)
-----   Message from Linzie Collinavid James Evans, MD sent at 12/19/2016  4:37 PM EST ----- All results within normal range.

## 2016-12-19 NOTE — Telephone Encounter (Signed)
Pt informed of neg results

## 2017-01-01 NOTE — L&D Delivery Note (Signed)
Delivery Note  First Stage: Labor onset: 1730 Augmentation :Pitocin Analgesia /Anesthesia intrapartum: Epidural PROM at 0500  Second Stage: Complete dilation at 0120 Onset of pushing at 0124 FHR second stage: Cat II, terminal bradycardia to 95bpm, mod variability, x 8min  Delivery of a viable female on 04/04/17 at @TODAY @ at 0136 by CNM delivery of fetal head in LOA position with restitution to LOT. No nuchal cord;  Anterior then posterior shoulders delivered easily with gentle downward traction. Baby placed on mom's chest, and attended to by peds.  Cord double clamped after cessation of pulsation, cut by FOB   Third Stage: Placenta delivered spontaneously intact with 3VC at 0143 Placenta disposition: routine disposal Uterine tone firm / bleeding scant  No laceration identified upon inspection of cervix, vagina and perineum.  Est. Blood Loss (mL): 100  Complications: none  Mom to postpartum.  Baby to Couplet care / Skin to Skin.  Newborn: Birth Weight: pending  Apgar Scores: 8/9 Feeding planned: Breast

## 2017-01-03 ENCOUNTER — Other Ambulatory Visit: Payer: Self-pay

## 2017-01-11 ENCOUNTER — Other Ambulatory Visit: Payer: Medicaid Other

## 2017-01-11 ENCOUNTER — Other Ambulatory Visit: Payer: Self-pay

## 2017-01-11 ENCOUNTER — Ambulatory Visit (INDEPENDENT_AMBULATORY_CARE_PROVIDER_SITE_OTHER): Payer: Medicaid Other

## 2017-01-11 ENCOUNTER — Telehealth: Payer: Self-pay | Admitting: Obstetrics and Gynecology

## 2017-01-11 DIAGNOSIS — O4442 Low lying placenta NOS or without hemorrhage, second trimester: Secondary | ICD-10-CM

## 2017-01-11 MED ORDER — ALBUTEROL SULFATE HFA 108 (90 BASE) MCG/ACT IN AERS: 2.0000 | INHALATION_SPRAY | Freq: Four times a day (QID) | RESPIRATORY_TRACT | 2 refills | Status: DC | PRN

## 2017-01-11 MED ORDER — ALBUTEROL SULFATE HFA 108 (90 BASE) MCG/ACT IN AERS: 2.0000 | INHALATION_SPRAY | Freq: Four times a day (QID) | RESPIRATORY_TRACT | 2 refills | Status: AC | PRN

## 2017-01-11 NOTE — Addendum Note (Signed)
Addended by: Marchelle FolksMILLER, Daionna Crossland G on: 01/11/2017 08:53 AM   Modules accepted: Orders

## 2017-01-11 NOTE — Telephone Encounter (Signed)
The patient called and stated that she would like to get refill of her prescription albuterol (PROVENTIL HFA;VENTOLIN HFA) 108 (90 Base) MCG/ACT inhaler, The patient would also like for a nurse to give her a call back. Please advise.

## 2017-01-11 NOTE — Telephone Encounter (Signed)
Due to transmission failure to pharmacy- V.O. Given for albuterol inhaler

## 2017-01-12 LAB — CBC
Hematocrit: 31.5 % — ABNORMAL LOW (ref 34.0–46.6)
Hemoglobin: 10.4 g/dL — ABNORMAL LOW (ref 11.1–15.9)
MCH: 29.1 pg (ref 26.6–33.0)
MCHC: 33 g/dL (ref 31.5–35.7)
MCV: 88 fL (ref 79–97)
PLATELETS: 195 10*3/uL (ref 150–379)
RBC: 3.58 x10E6/uL — ABNORMAL LOW (ref 3.77–5.28)
RDW: 14 % (ref 12.3–15.4)
WBC: 8.4 10*3/uL (ref 3.4–10.8)

## 2017-01-12 LAB — GLUCOSE, 1 HOUR GESTATIONAL: Gestational Diabetes Screen: 127 mg/dL (ref 65–139)

## 2017-01-12 LAB — RPR: RPR: NONREACTIVE

## 2017-01-23 ENCOUNTER — Telehealth: Payer: Self-pay | Admitting: *Deleted

## 2017-01-23 NOTE — Telephone Encounter (Signed)
Patient is planning on transferring care to [facility]. Patient needs her last U/s report release before she can make her appt at [facility]. Please advise. So we can fax over her medical records. Thank you

## 2017-01-24 NOTE — Telephone Encounter (Signed)
Notified pt Katrina [name] was faxed to [facility]

## 2017-01-28 ENCOUNTER — Encounter: Payer: Medicaid Other | Admitting: Obstetrics and Gynecology

## 2017-02-23 ENCOUNTER — Other Ambulatory Visit: Payer: Self-pay

## 2017-02-23 ENCOUNTER — Ambulatory Visit
Admission: EM | Admit: 2017-02-23 | Discharge: 2017-02-23 | Disposition: A | Discharge status: Home / Self Care | Payer: Medicaid Other | Attending: Physician Assistant | Admitting: Physician Assistant

## 2017-02-23 DIAGNOSIS — Z331 Pregnant state, incidental: Secondary | ICD-10-CM | POA: Diagnosis not present

## 2017-02-23 DIAGNOSIS — J40 Bronchitis, not specified as acute or chronic: Secondary | ICD-10-CM

## 2017-02-23 DIAGNOSIS — R091 Pleurisy: Secondary | ICD-10-CM | POA: Diagnosis not present

## 2017-02-23 DIAGNOSIS — J069 Acute upper respiratory infection, unspecified: Secondary | ICD-10-CM

## 2017-02-23 DIAGNOSIS — Z3A35 35 weeks gestation of pregnancy: Secondary | ICD-10-CM

## 2017-02-23 MED ORDER — AZITHROMYCIN 250 MG PO TABS: ORAL_TABLET | ORAL | 0 refills | Status: DC

## 2017-02-23 NOTE — Discharge Instructions (Signed)
-  azithromycin: Take 2 tablets by mouth the first day followed by one tablet daily for next 4 days. -continue OTC medications as needed for cough -Tylenol for pain or fever -contact [facility] for further -albuterol as needed

## 2017-02-23 NOTE — ED Triage Notes (Signed)
Patient c/o cough, congestion and chest pain in the middle of chest when taking a breath x 2 days.

## 2017-02-23 NOTE — ED Provider Notes (Signed)
[facility]    CSN: [id number] Arrival date & time: 02/23/17  1336     History   Chief Complaint Chief Complaint  Patient presents with  . Cough    HPI Katrina [name] is a 22 y.o. female.   Patient is a 22 year old female at 4735 weeks gestation who presents with complaint of shortness of breath, cough with production of green sputum, decreased appetite, and fever couple days ago.  Patient states she initially thought it was flu as her mother had been diagnosed with the flu but she apparently got better after a few days.  Patient's current symptoms began about 2 days ago.  Patient states her cough initially was nonproductive but is now productive.  She has been taking Robitussin but not seem to work very well.  Patient also taken Tylenol for fever and she is using her albuterol inhaler.  Patient also reports chills and runny nose but denies any ear issues and denies any congestion.  Patient reports some chest pain with deep breath and cough seems to be in her lungs and not really chest wall pain.  [facility].      Past Medical History:  Diagnosis Date  . Asthma     Patient Active Problem List   Diagnosis Date Noted  . Indication for care in labor or delivery 09/02/2014  . Lumbago 09/01/2014    History reviewed. No pertinent surgical history.  OB History    Gravida Para Term Preterm AB Living   3 1 1   1 1    SAB TAB Ectopic Multiple Live Births   1     0 1       Home Medications    Prior to Admission medications   Medication Sig Start Date End Date Taking? Authorizing Provider  albuterol (PROVENTIL HFA;VENTOLIN HFA) 108 (90 Base) MCG/ACT inhaler Inhale 2 puffs into the lungs every 6 (six) hours as needed for wheezing or shortness of breath. 01/11/17  Yes Hildred Laserherry, Anika, MD  Prenatal Vit-Fe Fumarate-FA (PRENATAL MULTIVITAMIN) TABS tablet Take 1 tablet by mouth daily at 12 noon. 09/05/14  Yes Nadara MustardHarris, Robert P, MD    azithromycin (ZITHROMAX Z-PAK) 250 MG tablet Take 2 tablets by mouth the first day followed by one tablet daily for next 4 days. 02/23/17   Candis SchatzHarris, Shaakira Borrero D, PA-C    Family History No family history on file.  Social History Social History   Tobacco Use  . Smoking status: Former Games developermoker  . Smokeless tobacco: Never Used  Substance Use Topics  . Alcohol use: No  . Drug use: No     Allergies   Patient has no known allergies.   Review of Systems Review of Systems  As noted above in HPI.  Other systems reviewed and found to be negative   Physical Exam Triage Vital Signs ED Triage Vitals  Enc Vitals Group     BP 02/23/17 1348 (!) 98/53     Pulse Rate 02/23/17 1348 88     Resp --      Temp 02/23/17 1348 97.8 F (36.6 C)     Temp Source 02/23/17 1348 Oral     SpO2 02/23/17 1348 100 %     Weight 02/23/17 1347 130 lb (59 kg)     Height 02/23/17 1347 4\' 10"  (1.473 m)     Head Circumference --      Peak Flow --      Pain Score 02/23/17  1346 6     Pain Loc --      Pain Edu? --      Excl. in GC? --    No data found.  Updated Vital Signs BP (!) 98/53 (BP Location: Left Arm)   Pulse 88   Temp 97.8 F (36.6 C) (Oral)   Ht 4\' 10"  (1.473 m)   Wt 130 lb (59 kg)   LMP 06/21/2016 (Exact Date)   SpO2 100%   BMI 27.17 kg/m   Physical Exam  Constitutional: She is oriented to person, place, and time. She appears well-developed and well-nourished. No distress.  HENT:  Head: Normocephalic and atraumatic.  Eyes: EOM are normal. Pupils are equal, round, and reactive to light.  Neck: Normal range of motion.  Cardiovascular: Normal rate, regular rhythm and normal heart sounds.  No murmur heard. Pulmonary/Chest: Effort normal. She has no wheezes.  Cough with deep inspiration.  No chest wall tenderness to palpation or having patient move both arms backwards in a bench press type of movement.  Patient does still report lung type of pain with deep breath  Abdominal: Bowel sounds  are normal.  Within normal limits for gestational age.  Musculoskeletal: Normal range of motion.  Lymphadenopathy:    She has cervical adenopathy (right).  Neurological: She is alert and oriented to person, place, and time. No cranial nerve deficit.  Skin: Skin is warm and dry.  Psychiatric: She has a normal mood and affect. Her behavior is normal.     UC Treatments / Results  Labs (all labs ordered are listed, but only abnormal results are displayed) Labs Reviewed - No data to display  EKG  EKG Interpretation None       Radiology No results found.  Procedures Procedures (including critical care time)  Medications Ordered in UC Medications - No data to display   Initial Impression / Assessment and Plan / UC Course  I have reviewed the triage vital signs and the nursing notes.  Pertinent labs & imaging results that were available during my care of the patient were reviewed by me and considered in my medical decision making (see chart for details).    Patient with productive cough, pleurisy, and likely bronchitis in the setting of [redacted] weeks gestation.  We will have the patient continue over-the-counter medications for cough.  Give her prescription for azithromycin.  Advised her to take Tylenol for her pain or fever.  Asked her to contact her obstetrician at [facility] for any further instructions as she is nearing her estimated delivery date within the next month.  Patient also use her albuterol inhaler as needed.  Final Clinical Impressions(s) / UC Diagnoses   Final diagnoses:  [redacted] weeks gestation of pregnancy  Pleurisy  Upper respiratory tract infection, unspecified type  Bronchitis    ED Discharge Orders        Ordered    azithromycin (ZITHROMAX Z-PAK) 250 MG tablet     02/23/17 1420       Controlled Substance Prescriptions [place] Controlled Substance Registry consulted? Not Applicable   Candis Schatz, PA-C 02/23/17 1423

## 2017-04-02 ENCOUNTER — Other Ambulatory Visit: Payer: Self-pay | Admitting: Certified Nurse Midwife

## 2017-04-03 ENCOUNTER — Inpatient Hospital Stay: Payer: Medicaid Other | Admitting: Anesthesiology

## 2017-04-03 ENCOUNTER — Encounter: Payer: Self-pay | Admitting: *Deleted

## 2017-04-03 ENCOUNTER — Inpatient Hospital Stay
Admission: EM | Admit: 2017-04-03 | Discharge: 2017-04-05 | DRG: 807 | Disposition: A | Discharge status: Home / Self Care | Payer: Medicaid Other | Attending: Obstetrics and Gynecology | Admitting: Obstetrics and Gynecology

## 2017-04-03 ENCOUNTER — Other Ambulatory Visit: Payer: Self-pay

## 2017-04-03 DIAGNOSIS — J45909 Unspecified asthma, uncomplicated: Secondary | ICD-10-CM | POA: Diagnosis present

## 2017-04-03 DIAGNOSIS — O9952 Diseases of the respiratory system complicating childbirth: Secondary | ICD-10-CM | POA: Diagnosis present

## 2017-04-03 DIAGNOSIS — O4292 Full-term premature rupture of membranes, unspecified as to length of time between rupture and onset of labor: Principal | ICD-10-CM | POA: Diagnosis present

## 2017-04-03 DIAGNOSIS — D649 Anemia, unspecified: Secondary | ICD-10-CM | POA: Diagnosis present

## 2017-04-03 DIAGNOSIS — O9902 Anemia complicating childbirth: Secondary | ICD-10-CM | POA: Diagnosis present

## 2017-04-03 DIAGNOSIS — Z87891 Personal history of nicotine dependence: Secondary | ICD-10-CM | POA: Diagnosis not present

## 2017-04-03 DIAGNOSIS — O99824 Streptococcus B carrier state complicating childbirth: Secondary | ICD-10-CM | POA: Diagnosis present

## 2017-04-03 DIAGNOSIS — Z3A40 40 weeks gestation of pregnancy: Secondary | ICD-10-CM | POA: Diagnosis not present

## 2017-04-03 DIAGNOSIS — Z3490 Encounter for supervision of normal pregnancy, unspecified, unspecified trimester: Secondary | ICD-10-CM | POA: Diagnosis present

## 2017-04-03 LAB — CBC
HCT: 33.7 % — ABNORMAL LOW (ref 35.0–47.0)
Hemoglobin: 11.3 g/dL — ABNORMAL LOW (ref 12.0–16.0)
MCH: 30.1 pg (ref 26.0–34.0)
MCHC: 33.6 g/dL (ref 32.0–36.0)
MCV: 89.6 fL (ref 80.0–100.0)
PLATELETS: 179 10*3/uL (ref 150–440)
RBC: 3.75 MIL/uL — ABNORMAL LOW (ref 3.80–5.20)
RDW: 18.2 % — AB (ref 11.5–14.5)
WBC: 7.4 10*3/uL (ref 3.6–11.0)

## 2017-04-03 LAB — TYPE AND SCREEN
ABO/RH(D): A POS
Antibody Screen: NEGATIVE

## 2017-04-03 MED ORDER — BUTORPHANOL TARTRATE 1 MG/ML IJ SOLN: 1.0000 mg | INTRAMUSCULAR | Status: DC | PRN

## 2017-04-03 MED ORDER — PHENYLEPHRINE 40 MCG/ML (10ML) SYRINGE FOR IV PUSH (FOR BLOOD PRESSURE SUPPORT)
80.0000 ug | PREFILLED_SYRINGE | INTRAVENOUS | Status: DC | PRN
  Filled 2017-04-03: qty 5

## 2017-04-03 MED ORDER — SOD CITRATE-CITRIC ACID 500-334 MG/5ML PO SOLN: 30.0000 mL | ORAL | Status: DC | PRN

## 2017-04-03 MED ORDER — TERBUTALINE SULFATE 1 MG/ML IJ SOLN: 0.2500 mg | Freq: Once | INTRAMUSCULAR | Status: DC | PRN

## 2017-04-03 MED ORDER — LACTATED RINGERS IV SOLN
500.0000 mL | INTRAVENOUS | Status: DC | PRN
  Administered 2017-04-03: 500 mL via INTRAVENOUS

## 2017-04-03 MED ORDER — OXYTOCIN BOLUS FROM INFUSION
500.0000 mL | Freq: Once | INTRAVENOUS | Status: AC
  Administered 2017-04-04: 500 mL via INTRAVENOUS

## 2017-04-03 MED ORDER — FENTANYL 2.5 MCG/ML W/ROPIVACAINE 0.15% IN NS 100 ML EPIDURAL (ARMC)
EPIDURAL | Status: AC
  Filled 2017-04-03: qty 100

## 2017-04-03 MED ORDER — EPHEDRINE 5 MG/ML INJ
10.0000 mg | INTRAVENOUS | Status: DC | PRN
  Filled 2017-04-03: qty 2

## 2017-04-03 MED ORDER — PENICILLIN G POTASSIUM 5000000 UNITS IJ SOLR
5.0000 10*6.[IU] | Freq: Once | INTRAMUSCULAR | Status: AC
  Administered 2017-04-03: 5 10*6.[IU] via INTRAVENOUS
  Filled 2017-04-03: qty 5

## 2017-04-03 MED ORDER — OXYTOCIN 40 UNITS IN LACTATED RINGERS INFUSION - SIMPLE MED
2.5000 [IU]/h | INTRAVENOUS | Status: DC
  Administered 2017-04-04: 2.5 [IU]/h via INTRAVENOUS

## 2017-04-03 MED ORDER — LIDOCAINE HCL (PF) 1 % IJ SOLN
30.0000 mL | INTRAMUSCULAR | Status: DC | PRN
  Filled 2017-04-03: qty 30

## 2017-04-03 MED ORDER — MISOPROSTOL 200 MCG PO TABS
ORAL_TABLET | ORAL | Status: AC
  Filled 2017-04-03: qty 4

## 2017-04-03 MED ORDER — LIDOCAINE-EPINEPHRINE (PF) 1.5 %-1:200000 IJ SOLN
INTRAMUSCULAR | Status: DC | PRN
  Administered 2017-04-03: 3 mL via EPIDURAL

## 2017-04-03 MED ORDER — BUPIVACAINE HCL (PF) 0.25 % IJ SOLN
INTRAMUSCULAR | Status: DC | PRN
  Administered 2017-04-03: 4 mL via EPIDURAL
  Administered 2017-04-03: 2 mL via EPIDURAL
  Administered 2017-04-03: 12 mL via EPIDURAL

## 2017-04-03 MED ORDER — OXYTOCIN 40 UNITS IN LACTATED RINGERS INFUSION - SIMPLE MED
1.0000 m[IU]/min | INTRAVENOUS | Status: DC
  Administered 2017-04-03: 2 m[IU]/min via INTRAVENOUS
  Filled 2017-04-03: qty 1000

## 2017-04-03 MED ORDER — AMMONIA AROMATIC IN INHA
RESPIRATORY_TRACT | Status: AC
  Filled 2017-04-03: qty 10

## 2017-04-03 MED ORDER — ONDANSETRON HCL 4 MG/2ML IJ SOLN
4.0000 mg | Freq: Four times a day (QID) | INTRAMUSCULAR | Status: DC | PRN
  Administered 2017-04-04: 4 mg via INTRAVENOUS
  Filled 2017-04-03: qty 2

## 2017-04-03 MED ORDER — LACTATED RINGERS IV SOLN
INTRAVENOUS | Status: DC
  Administered 2017-04-03 (×2): via INTRAVENOUS

## 2017-04-03 MED ORDER — LIDOCAINE HCL (PF) 1 % IJ SOLN
INTRAMUSCULAR | Status: DC | PRN
  Administered 2017-04-03: 3 mL via INTRADERMAL

## 2017-04-03 MED ORDER — FENTANYL 2.5 MCG/ML W/ROPIVACAINE 0.15% IN NS 100 ML EPIDURAL (ARMC)
12.0000 mL/h | EPIDURAL | Status: DC
  Administered 2017-04-03: 12 mL/h via EPIDURAL

## 2017-04-03 MED ORDER — PENICILLIN G POT IN DEXTROSE 60000 UNIT/ML IV SOLN
3.0000 10*6.[IU] | INTRAVENOUS | Status: DC
  Administered 2017-04-03 (×2): 3 10*6.[IU] via INTRAVENOUS
  Filled 2017-04-03 (×10): qty 50

## 2017-04-03 MED ORDER — LACTATED RINGERS IV SOLN: 500.0000 mL | Freq: Once | INTRAVENOUS | Status: DC

## 2017-04-03 MED ORDER — DIPHENHYDRAMINE HCL 50 MG/ML IJ SOLN: 12.5000 mg | INTRAMUSCULAR | Status: DC | PRN

## 2017-04-03 MED ORDER — ACETAMINOPHEN 325 MG PO TABS
650.0000 mg | ORAL_TABLET | ORAL | Status: DC | PRN
  Filled 2017-04-03: qty 2

## 2017-04-03 NOTE — H&P (Addendum)
OB History & Physical   History of Present Illness:  Chief Complaint: watery discharge this am at 0500  HPI:  Blessing Zaucha is a 22 y.o. G51P1011 female at [redacted]w[redacted]d dated by LMP.  She presents to L&D for PROM.  Active FM Feels occasional UCs in her back.  LOF starting at 0500 this am, clear with pink tinge.  bloody show: None    Pregnancy Issues: 1. Asthma, flovent and zyrtec preventative, albuterol prn. 2. GBS bacteriuria 3. Anemia    Maternal Medical History:   Past Medical History:  Diagnosis Date  . Asthma     Pt denies surgical history.   No Known Allergies  Prior to Admission medications   Medication Sig Start Date End Date Taking? Authorizing Provider  albuterol (PROVENTIL HFA;VENTOLIN HFA) 108 (90 Base) MCG/ACT inhaler Inhale 2 puffs into the lungs every 6 (six) hours as needed for wheezing or shortness of breath. 01/11/17   Hildred Laser, MD  azithromycin (ZITHROMAX Z-PAK) 250 MG tablet Take 2 tablets by mouth the first day followed by one tablet daily for next 4 days. 02/23/17   Candis Schatz, PA-C  Prenatal Vit-Fe Fumarate-FA (PRENATAL MULTIVITAMIN) TABS tablet Take 1 tablet by mouth daily at 12 noon. 09/05/14   Nadara Mustard, MD     Prenatal care site: [facility]   Social History: She  reports that she has quit smoking. She has never used smokeless tobacco. She reports that she does not drink alcohol or use drugs.  Family History: No family hx Gyn cancers  Review of Systems: A full review of systems was performed and negative except as noted in the HPI.     Physical Exam:  Vital Signs: BP 120/76   Pulse 92   Temp 97.9 F (36.6 C) (Oral)   Ht 4\' 10"  (1.473 m)   Wt 135 lb (61.2 kg)   LMP 06/21/2016 (Exact Date)   BMI 28.22 kg/m  General: no acute distress.  HEENT: normocephalic, atraumatic Heart: regular rate & rhythm.  No murmurs/rubs/gallops Lungs: clear to auscultation bilaterally, normal respiratory effort Abdomen: soft,  gravid, non-tender;  EFW: 6.5lbs Pelvic:   External: Normal external female genitalia  Cervix: SVE in office, 4/50/-2   Extremities: non-tender, symmetric, no edema bilaterally.  DTRs: 2+  Neurologic: Alert & oriented x 3.    Results for orders placed or performed during the hospital encounter of 04/03/17 (from the past 24 hour(s))  CBC     Status: Abnormal   Collection Time: 04/03/17  1:35 PM  Result Value Ref Range   WBC 7.4 3.6 - 11.0 K/uL   RBC 3.75 (L) 3.80 - 5.20 MIL/uL   Hemoglobin 11.3 (L) 12.0 - 16.0 g/dL   HCT 53.6 (L) 64.4 - 03.4 %   MCV 89.6 80.0 - 100.0 fL   MCH 30.1 26.0 - 34.0 pg   MCHC 33.6 32.0 - 36.0 g/dL   RDW 74.2 (H) 59.5 - 63.8 %   Platelets 179 150 - 440 K/uL  Type and screen     Status: None (Preliminary result)   Collection Time: 04/03/17  1:35 PM  Result Value Ref Range   ABO/RH(D) PENDING    Antibody Screen PENDING    Sample Expiration      04/06/2017 Performed at [facility], [address] [place], [place] [address]     Pertinent Results:  Prenatal Labs: Blood type/Rh A pos  Antibody screen neg  Rubella Immune  Varicella Immune  RPR NR  HBsAg Neg  HIV NR  GC neg  Chlamydia neg  Genetic screening negative  1 hour GTT 127  GBS Pos urine 09/2016   FHT: 130bpm, Mod variability, + accels, no decels TOCO: occasional UC SVE:  Deferred for now. Done in office at 1030   Cephalic by leopolds  No results found.  Assessment:  Reita MayChelsea Leigh Sian is a 22 y.o. 603P1011 female at 7537w6d with PROM x 9hrs.   Plan:  1. Admit to Labor & Delivery; consents reviewed and obtained  2. Fetal Well being  - Fetal Tracing: Cat I tracing - Group B Streptococcus ppx indicated: Pos - Presentation: cephalic confirmed by exam and leopolds   3. Routine OB: - Prenatal labs reviewed, as above - Rh: A Pos - CBC, T&S, RPR on admit - Clear fluids, IVF  4. Monitoring of Labor: Plan augmentation -  Contractions: external toco in place -  Pelvis  proven to 6#2 -  Plan for induction with Pitocin -  Plan for continuous fetal monitoring  -  Maternal pain control as desired; Plans Epidural - Anticipate vaginal delivery  5. Post Partum Planning: - Infant feeding: Breast - Contraception: paragard IUD  Benancio Osmundson A, CNM 04/03/17 2:01 PM

## 2017-04-03 NOTE — Anesthesia Preprocedure Evaluation (Signed)
Anesthesia Evaluation  Patient identified by MRN, date of birth, ID band Patient awake    Reviewed: Allergy & Precautions, H&P , NPO status , Patient's Chart, lab work & pertinent test results, reviewed documented beta blocker date and time   Airway Mallampati: II  TM Distance: >3 FB Neck ROM: full    Dental no notable dental hx. (+) Teeth Intact   Pulmonary neg pulmonary ROS, Current Smoker, former smoker,    Pulmonary exam normal breath sounds clear to auscultation       Cardiovascular Exercise Tolerance: Good negative cardio ROS   Rhythm:regular Rate:Normal     Neuro/Psych negative neurological ROS  negative psych ROS   GI/Hepatic negative GI ROS, Neg liver ROS,   Endo/Other  negative endocrine ROSdiabetes  Renal/GU      Musculoskeletal   Abdominal   Peds  Hematology negative hematology ROS (+)   Anesthesia Other Findings   Reproductive/Obstetrics (+) Pregnancy                             Anesthesia Physical Anesthesia Plan  ASA: II  Anesthesia Plan: Epidural   Post-op Pain Management:    Induction:   PONV Risk Score and Plan:   Airway Management Planned:   Additional Equipment:   Intra-op Plan:   Post-operative Plan:   Informed Consent: I have reviewed the patients History and Physical, chart, labs and discussed the procedure including the risks, benefits and alternatives for the proposed anesthesia with the patient or authorized representative who has indicated his/her understanding and acceptance.       Plan Discussed with:   Anesthesia Plan Comments:         Anesthesia Quick Evaluation  

## 2017-04-03 NOTE — Progress Notes (Signed)
Labor Progress Note  Katrina Katrina [name] is Katrina [name] 22 y.o. G3P1011 at 3452w6d by LMP admitted for PROM   Subjective: comfortable after epidural. Denies pressure.   Objective: BP 126/75   Pulse 86   Temp 98.1 F (36.7 C) (Oral)   Resp 18   Ht 4\' 10"  (1.473 m)   Wt 135 lb (61.2 kg)   LMP 06/21/2016 (Exact Date)   SpO2 100%   BMI 28.22 kg/m  Notable VS details: reviewed  Fetal Assessment: FHT:  FHR: 135 bpm, variability: moderate,  accelerations:  Present,  decelerations:  Absent Category/reactivity:  Category I UC:   regular, every 2-3 minutes SVE:  6/C/-1 Membrane status:PROM x 16hrs Amniotic color: clear, scant bloody show  Labs: Lab Results  Component Value Date   WBC 7.4 04/03/2017   HGB 11.3 (L) 04/03/2017   HCT 33.7 (L) 04/03/2017   MCV 89.6 04/03/2017   PLT 179 04/03/2017    Assessment / Plan: Augmentation of labor, progressing well  Labor: progressig well on Pitocin, now active labor Fetal Wellbeing:  Category I Pain Control:  Epidural I/D:  GBS prophy with PCN Anticipated MOD:  NSVD  Katrina Katrina [name], CNM 04/03/2017, 9:00 PM

## 2017-04-03 NOTE — Anesthesia Procedure Notes (Signed)
Epidural Patient location during procedure: OB  Staffing Performed: anesthesiologist   Preanesthetic Checklist Completed: patient identified, site marked, surgical consent, pre-op evaluation, timeout performed, IV checked, risks and benefits discussed and monitors and equipment checked  Epidural Patient position: sitting Prep: Betadine Patient monitoring: heart rate, continuous pulse ox and blood pressure Approach: midline Location: L3-L4 Injection technique: LOR saline  Needle:  Needle type: Tuohy  Needle gauge: 17 G Needle length: 9 cm and 9 Needle insertion depth: 4 cm Catheter type: closed end flexible Catheter size: 19 Gauge Catheter at skin depth: 10 cm Test dose: negative and 1.5% lidocaine with Epi 1:200 K  Assessment Sensory level: T10 Events: blood not aspirated, injection not painful, no injection resistance, negative IV test and no paresthesia  Additional Notes   Patient tolerated the insertion well without complications.-SATD -IVTD. No paresthesia. Refer to [facility] nursing for VS and dosingReason for block:procedure for pain     

## 2017-04-03 NOTE — Progress Notes (Signed)
Labor Progress Note  Katrina MayChelsea Leigh [name] is a 22 y.o. G3P1011 at 9875w6d by LMP admitted for PROM  Subjective: feeling mostly back pain, continues to leak scant fluid here and there. No bloody show.   Objective: BP 120/78   Pulse 72   Temp 98.2 F (36.8 C) (Oral)   Resp 16   Ht 4\' 10"  (1.473 m)   Wt 135 lb (61.2 kg)   LMP 06/21/2016 (Exact Date)   BMI 28.22 kg/m  Notable VS details: reviewed, remains afebrile.   Fetal Assessment: FHT:  FHR: 125 bpm, variability: moderate,  accelerations:  Present,  decelerations:  Absent Category/reactivity:  Category I UC:   regular, every 2-3 minutes; Pitocin at 516mu/min SVE:   5/75/-1, large gush of clear fluid with exam, + FM noted; FSE placed due to nursing having difficulty with continuous tracing.  Membrane status: ruptured, approx 12hrs Amniotic color: clear  Labs: Lab Results  Component Value Date   WBC 7.4 04/03/2017   HGB 11.3 (L) 04/03/2017   HCT 33.7 (L) 04/03/2017   MCV 89.6 04/03/2017   PLT 179 04/03/2017    Assessment / Plan: PROM at 40+6wks, augmentation with Pitocin, GBS Pos  Labor: some cervical change since check in office, SROM of forebag during exam. will continue to augment with Pitocin.  Fetal Wellbeing:  Category I Pain Control:  desires epidural now I/D:  GBS Prophy with PCN IVPB q4hr Anticipated MOD:  NSVD  McVey, REBECCA A, CNM 04/03/2017, 5:56 PM

## 2017-04-04 LAB — CBC
HEMATOCRIT: 34 % — AB (ref 35.0–47.0)
HEMOGLOBIN: 11.3 g/dL — AB (ref 12.0–16.0)
MCH: 29.9 pg (ref 26.0–34.0)
MCHC: 33.4 g/dL (ref 32.0–36.0)
MCV: 89.6 fL (ref 80.0–100.0)
Platelets: 163 10*3/uL (ref 150–440)
RBC: 3.79 MIL/uL — AB (ref 3.80–5.20)
RDW: 18.3 % — ABNORMAL HIGH (ref 11.5–14.5)
WBC: 14.7 10*3/uL — ABNORMAL HIGH (ref 3.6–11.0)

## 2017-04-04 LAB — SYPHILIS: RPR W/REFLEX TO RPR TITER AND TREPONEMAL ANTIBODIES, TRADITIONAL SCREENING AND DIAGNOSIS ALGORITHM: RPR Ser Ql: NONREACTIVE

## 2017-04-04 MED ORDER — IBUPROFEN 600 MG PO TABS
600.0000 mg | ORAL_TABLET | Freq: Four times a day (QID) | ORAL | Status: DC
  Administered 2017-04-04 – 2017-04-05 (×6): 600 mg via ORAL
  Filled 2017-04-04 (×6): qty 1

## 2017-04-04 MED ORDER — DIBUCAINE 1 % RE OINT: 1.0000 "application " | TOPICAL_OINTMENT | RECTAL | Status: DC | PRN

## 2017-04-04 MED ORDER — SIMETHICONE 80 MG PO CHEW: 80.0000 mg | CHEWABLE_TABLET | ORAL | Status: DC | PRN

## 2017-04-04 MED ORDER — ONDANSETRON HCL 4 MG/2ML IJ SOLN: 4.0000 mg | INTRAMUSCULAR | Status: DC | PRN

## 2017-04-04 MED ORDER — ZOLPIDEM TARTRATE 5 MG PO TABS: 5.0000 mg | ORAL_TABLET | Freq: Every evening | ORAL | Status: DC | PRN

## 2017-04-04 MED ORDER — DIPHENHYDRAMINE HCL 25 MG PO CAPS: 25.0000 mg | ORAL_CAPSULE | Freq: Four times a day (QID) | ORAL | Status: DC | PRN

## 2017-04-04 MED ORDER — BENZOCAINE-MENTHOL 20-0.5 % EX AERO
1.0000 "application " | INHALATION_SPRAY | CUTANEOUS | Status: DC | PRN
  Filled 2017-04-04: qty 56

## 2017-04-04 MED ORDER — PRENATAL MULTIVITAMIN CH
1.0000 | ORAL_TABLET | Freq: Every day | ORAL | Status: DC
  Administered 2017-04-04 – 2017-04-05 (×2): 1 via ORAL
  Filled 2017-04-04 (×2): qty 1

## 2017-04-04 MED ORDER — SENNOSIDES-DOCUSATE SODIUM 8.6-50 MG PO TABS: 2.0000 | ORAL_TABLET | ORAL | Status: DC

## 2017-04-04 MED ORDER — WITCH HAZEL-GLYCERIN EX PADS: 1.0000 "application " | MEDICATED_PAD | CUTANEOUS | Status: DC | PRN

## 2017-04-04 MED ORDER — COCONUT OIL OIL
1.0000 "application " | TOPICAL_OIL | Status: DC | PRN
  Filled 2017-04-04: qty 120

## 2017-04-04 MED ORDER — ONDANSETRON HCL 4 MG PO TABS: 4.0000 mg | ORAL_TABLET | ORAL | Status: DC | PRN

## 2017-04-04 MED ORDER — ACETAMINOPHEN 325 MG PO TABS
650.0000 mg | ORAL_TABLET | ORAL | Status: DC | PRN
  Administered 2017-04-04 (×2): 650 mg via ORAL
  Filled 2017-04-04: qty 2

## 2017-04-04 NOTE — Progress Notes (Signed)
Patient stable eating well bonding with infant. Fundus firm small amt of bleeding.

## 2017-04-04 NOTE — Progress Notes (Signed)
Post Partum Day Del Day Subjective: "I am doing okay this am"  Objective: Blood pressure 120/72, pulse (!) 102, temperature 98.3 F (36.8 C), temperature source Oral, resp. rate 20, height 4\' 10"  (1.473 m), weight 135 lb (61.2 kg), last menstrual period 06/21/2016, SpO2 98 %, unknown if currently breastfeeding.  Physical Exam:  General: A,A& O x3 HEART:S1s2, RRR, no M/R/G LUNGS:CTA bilat, no W/R/R Lochia: mod, no clots  Uterine Fundus: FF firm, U-2 Voiding well. DVT Evaluation:neg Homans  Recent Labs    04/03/17 1335 04/04/17 0607  HGB 11.3* 11.3*  HCT 33.7* 34.0*  WBC 7.4 14.7*  PLT 179 163    Assessment/Plan: A: PP Day Del day 2. NSVD of viable female P: Continue orders.   LOS: 1 day   Sharee PimpleCaron W Hershey Knauer 04/04/2017, 8:13 AM

## 2017-04-04 NOTE — Discharge Summary (Signed)
Obstetrical Discharge Summary  Patient Name: Katrina MayChelsea Leigh [name] DOB: 05/30/1995 MRN: [id number]  Date of Admission: 04/03/2017 Date of Delivery: 04/04/17 Delivered by: Bonnell Public McVey CNM Date of Discharge: 04/05/2017  [facility]  WUJ:[id number]'WLMP:Patient's last menstrual period was 06/21/2016 (exact date). EDC Estimated Date of Delivery: 03/28/17 Gestational Age at Delivery: 1050w0d   Antepartum complications:  1. Asthma, flovent and zyrtec preventative, albuterol prn.  2. GBS bacteriuria  3. Anemia  Admitting Diagnosis: PROM, GBS Pos, late term pregnancy Secondary Diagnosis: SVD term   Patient Active Problem List   Diagnosis Date Noted  . Encounter for planned induction of labor 04/03/2017  . Indication for care in labor or delivery 09/02/2014  . Lumbago 09/01/2014    Augmentation: Pitocin Complications: None Intrapartum complications/course: Augmentation with Pitocin, GBS prophy with PCN x 3doses.  Date of Delivery: 04/04/17 Delivered By: Bonnell Public McVey CNM Delivery Type: spontaneous vaginal delivery Anesthesia: epidural Placenta: spontaneous Laceration: none Episiotomy: none Newborn Data: Live born female  Birth Weight: 7lb 0.9oz  APGAR: 8, 9  Newborn Delivery   Birth date/time:  04/04/2017 01:36:00 Delivery type:  Vaginal, Spontaneous     Postpartum Procedures: None  Post partum course:  Patient had an uncomplicated postpartum course. By time of discharge on PPD#1, her pain was controlled on oral pain medications; she had appropriate lochia and was ambulating, voiding without difficulty and tolerating regular diet. She was deemed stable for discharge to home.    Discharge Physical Exam:  BP 104/66 (BP Location: Right Arm)   Pulse 72   Temp 98.2 F (36.8 C) (Oral)   Resp 18   Ht 4\' 10"  (1.473 m)   Wt 61.2 kg (135 lb)   LMP 06/21/2016 (Exact Date)   SpO2 99%   Breastfeeding? Unknown   BMI 28.22 kg/m   General: NAD CV: RRR Pulm: CTABL, nl effort ABD: s/nd/nt,  fundus firm and below the umbilicus Lochia: moderate DVT Evaluation: LE non-ttp, no evidence of DVT on exam.  Hemoglobin  Date Value Ref Range Status  04/04/2017 11.3 (L) 12.0 - 16.0 g/dL Final  29/56/213001/11/2017 86.510.4 (L) 11.1 - 15.9 g/dL Final   HCT  Date Value Ref Range Status  04/04/2017 34.0 (L) 35.0 - 47.0 % Final   Hematocrit  Date Value Ref Range Status  01/11/2017 31.5 (L) 34.0 - 46.6 % Final    Disposition: stable, discharge to home. Baby Feeding: breastmilk Baby Disposition: home with mom  Rh Immune globulin given: n/a Rubella vaccine given: n/a Varicella vaccine given: n/a Tdap vaccine given in AP or PP setting: AP Flu vaccine given in AP or PP setting: AP  Contraception: Paragard  Prenatal Labs:   Blood type/Rh A pos  Antibody screen neg  Rubella Immune  Varicella Immune  RPR NR  HBsAg Neg  HIV NR  GC neg  Chlamydia neg  Genetic screening negative  1 hour GTT 127  GBS Pos urine 09/2016    Plan:  Katrina [name] was discharged to home in good condition. Follow-up appointment with delivering provider in 6 weeks.  Discharge Medications: Allergies as of 04/05/2017   No Known Allergies     Medication List    STOP taking these medications   azithromycin 250 MG tablet Commonly known as:  ZITHROMAX Z-PAK   chromagen capsule     TAKE these medications   acetaminophen 325 MG tablet Commonly known as:  TYLENOL Take 2 tablets (650 mg total) by mouth every 4 (four) hours as needed for mild  pain or moderate pain.   albuterol 108 (90 Base) MCG/ACT inhaler Commonly known as:  PROVENTIL HFA;VENTOLIN HFA Inhale 2 puffs into the lungs every 6 (six) hours as needed for wheezing or shortness of breath.   ibuprofen 600 MG tablet Commonly known as:  ADVIL,MOTRIN Take 1 tablet (600 mg total) by mouth every 6 (six) hours as needed for mild pain, moderate pain or cramping.   prenatal multivitamin Tabs tablet Take 1 tablet by mouth daily at 12 noon.         SignedGenia Del CNM 04/05/2017 8:44 AM

## 2017-04-05 MED ORDER — IBUPROFEN 600 MG PO TABS: 600.0000 mg | ORAL_TABLET | Freq: Four times a day (QID) | ORAL | Status: AC | PRN

## 2017-04-05 MED ORDER — ACETAMINOPHEN 325 MG PO TABS: 650.0000 mg | ORAL_TABLET | ORAL | Status: AC | PRN

## 2017-04-05 NOTE — Progress Notes (Signed)
Patient discharged home with infant. Discharge instructions and prescriptions given and reviewed with patient. Patient verbalized understanding. Escorted out by auxillary.  

## 2017-04-05 NOTE — Discharge Instructions (Signed)

## 2017-04-05 NOTE — Anesthesia Postprocedure Evaluation (Signed)
Anesthesia Post Note  Patient: Katrina [name]  Procedure(s) Performed: AN AD HOC LABOR EPIDURAL  Patient location during evaluation: Mother Baby Anesthesia Type: Epidural Level of consciousness: awake, awake and alert and oriented Pain management: pain level controlled Vital Signs Assessment: post-procedure vital signs reviewed and stable Respiratory status: spontaneous breathing Cardiovascular status: blood pressure returned to baseline Postop Assessment: no headache, no backache, no apparent nausea or vomiting and adequate PO intake Anesthetic complications: no     Last Vitals:  Vitals:   04/04/17 2047 04/05/17 0010  BP: 115/71 136/80  Pulse: 79 66  Resp: 18 18  Temp: 37.1 C 36.9 C  SpO2: 100% 100%    Last Pain:  Vitals:   04/05/17 0518  TempSrc:   PainSc: 0-No pain                 Luticia Tadros Lawerance CruelStarr

## 2018-05-20 IMAGING — US US OB COMP LESS 14 WK
1 series · 14 of 28 positions shown · non-contrast
Comparison: None.

CLINICAL DATA: 20 y/o  F; 1 day of light vaginal bleeding.

EXAM:
OBSTETRIC <14 WK US AND TRANSVAGINAL OB US
TECHNIQUE: Both transabdominal and transvaginal ultrasound examinations were
performed for complete evaluation of the gestation as well as the
maternal uterus, adnexal regions, and pelvic cul-de-sac.
Transvaginal technique was performed to assess early pregnancy.

[Series 1: us ob comp less 14 wk · 0.15mm/px · 14 of 57 slices shown]
[im 3/57]
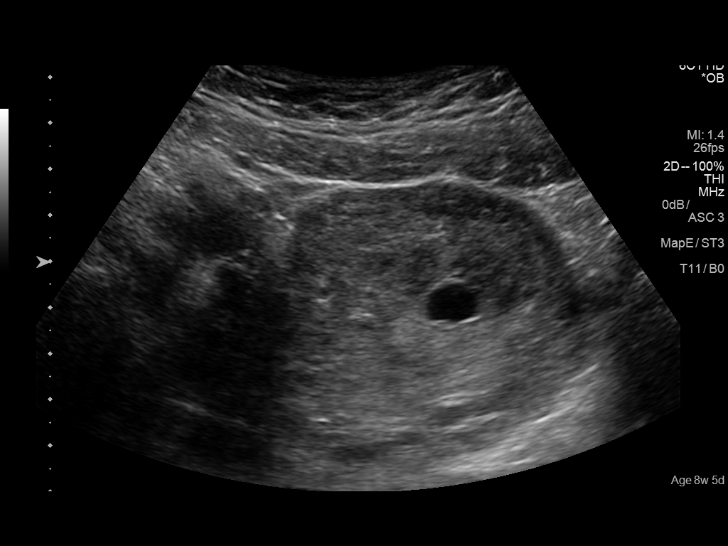
[im 7/57]
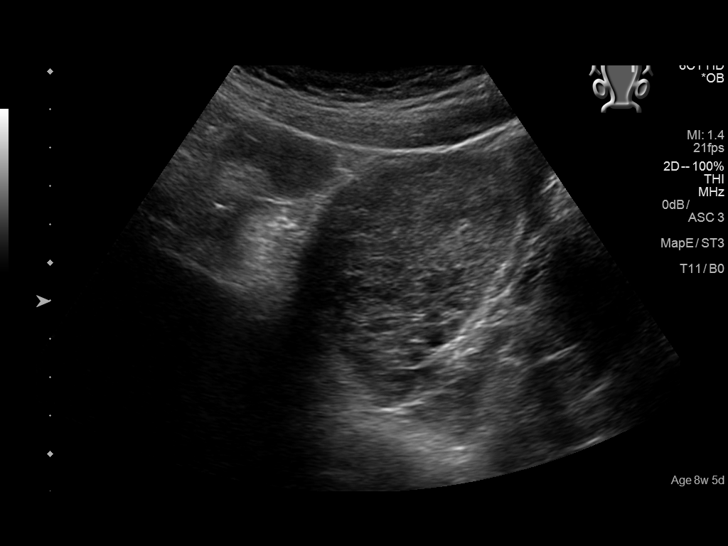
[im 11/57]
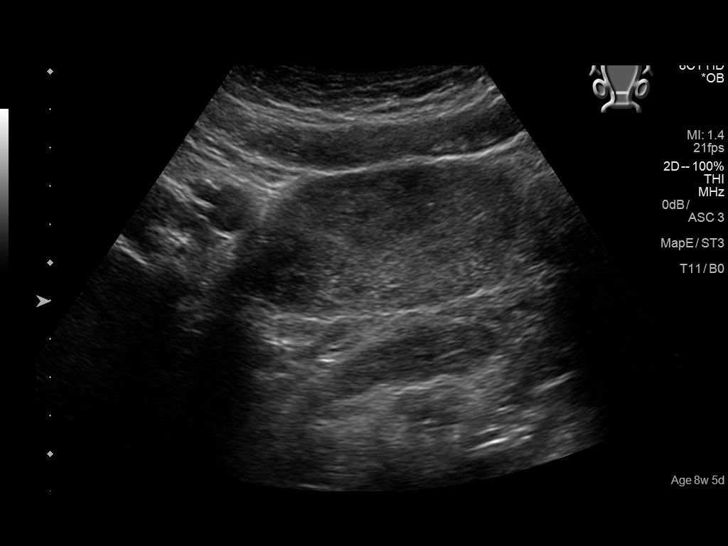
[im 15/57]
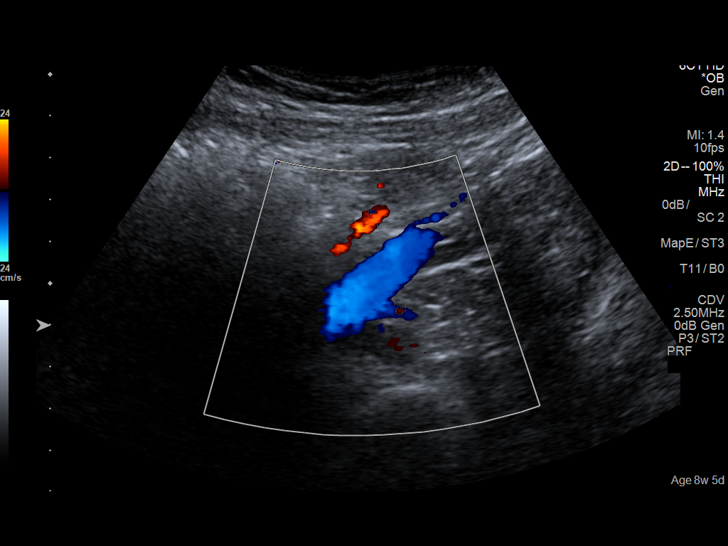
[im 19/57]
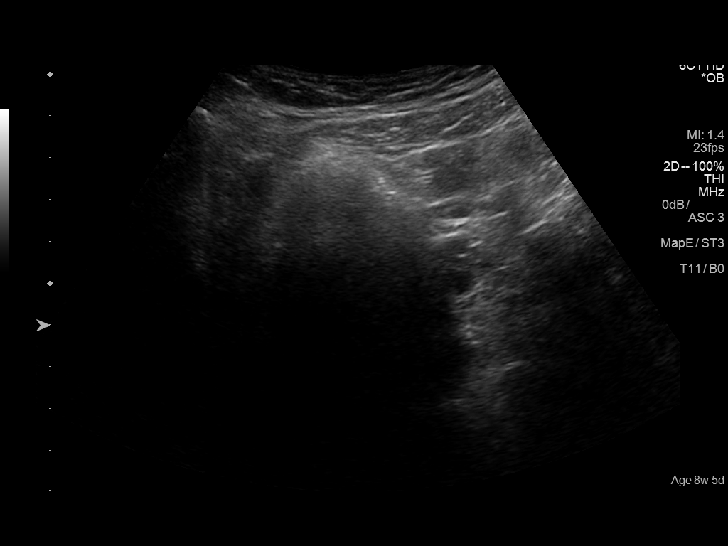
[im 23/57]
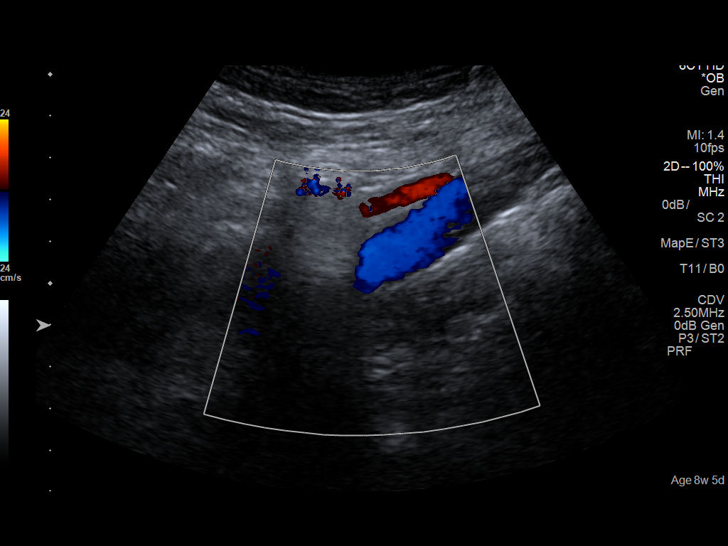
[im 27/57]
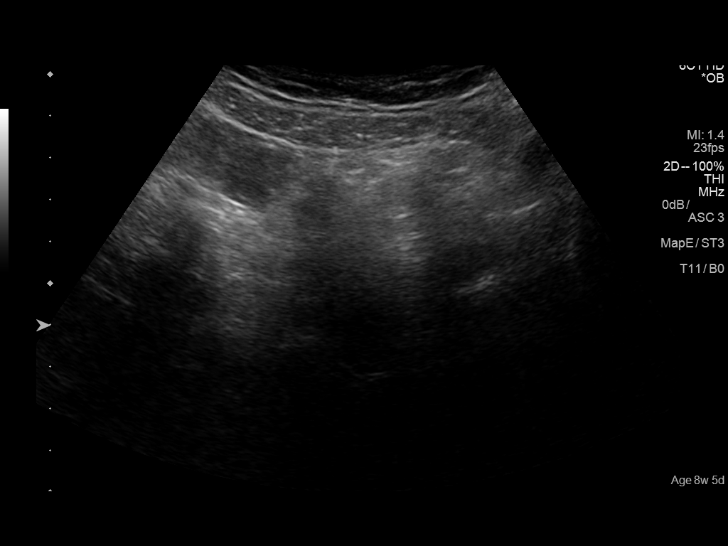
[im 32/57]
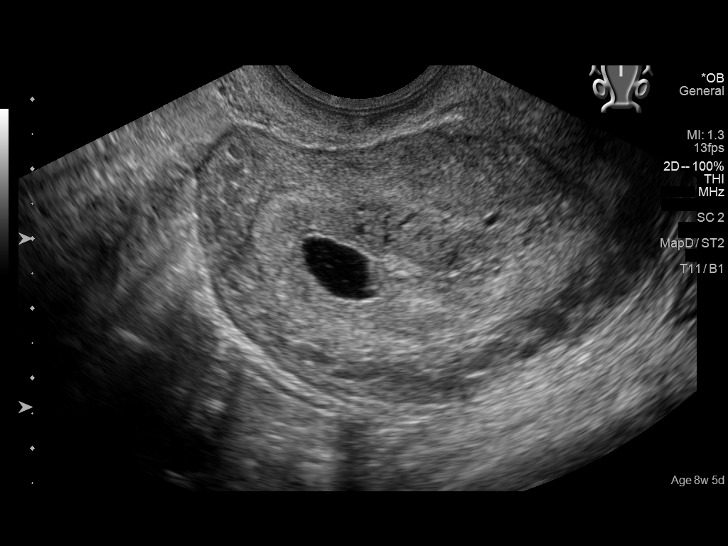
[im 36/57]
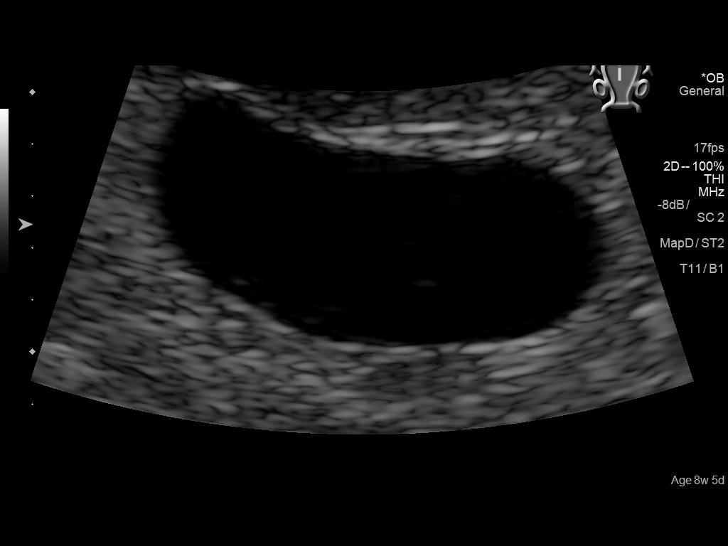
[im 40/57]
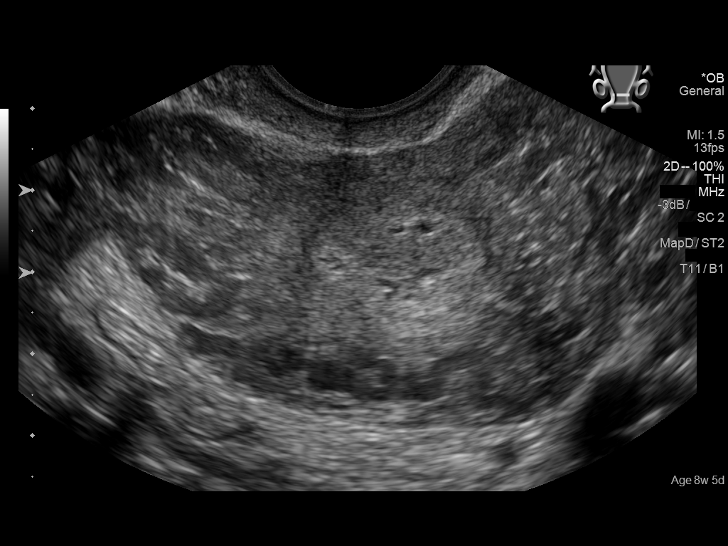
[im 44/57]
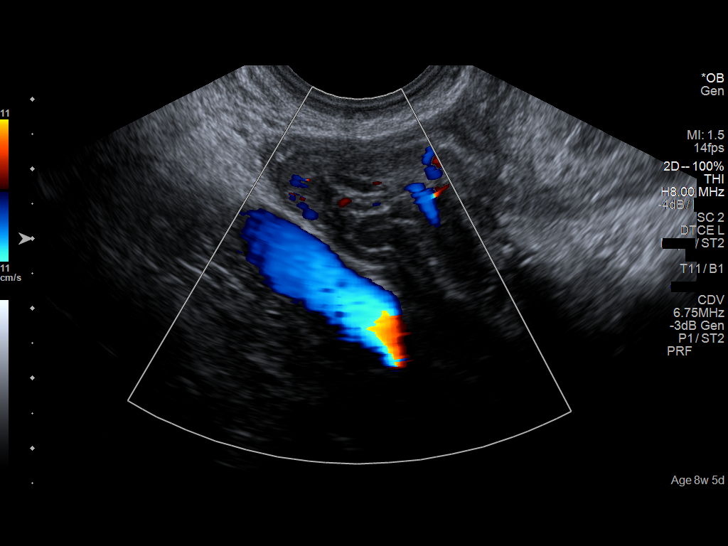
[im 48/57]
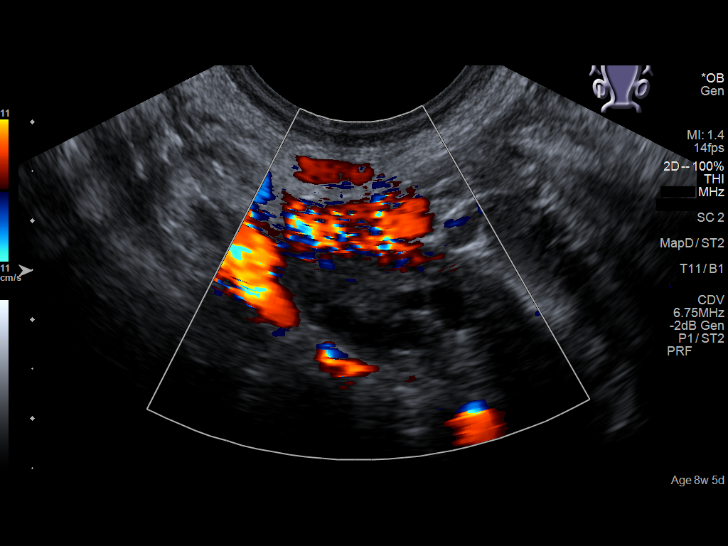
[im 52/57]
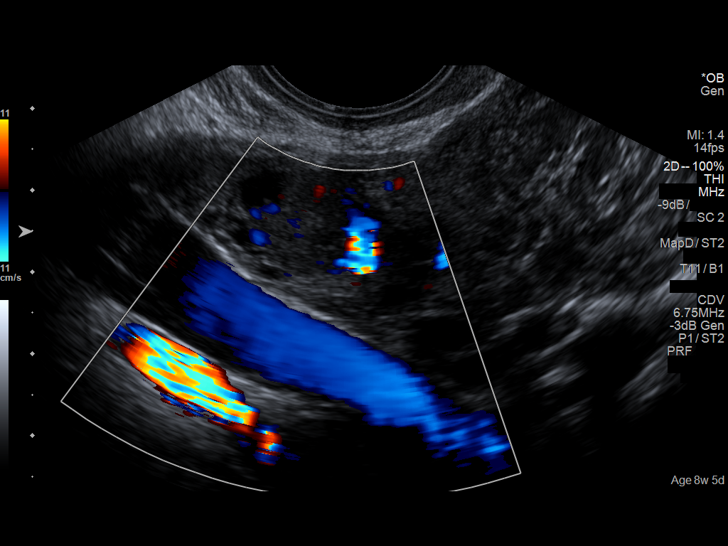
[im 57/57]
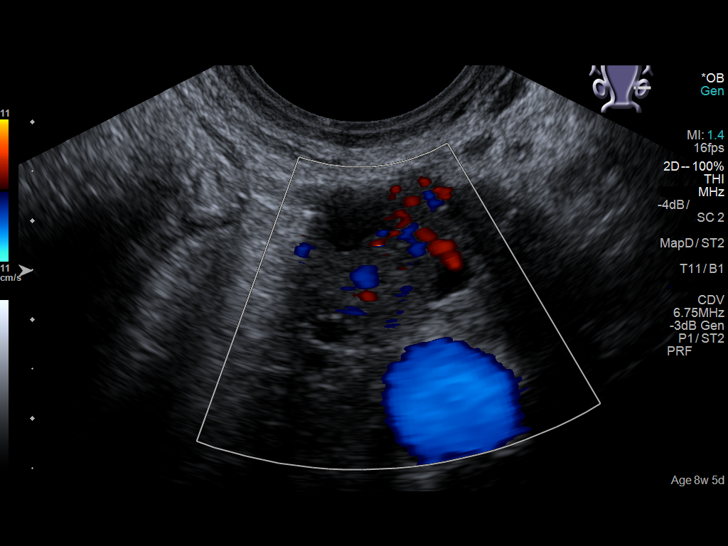

[14 of 28 positions shown; findings below may reference images not displayed]

FINDINGS: Intrauterine gestational sac: Single

Yolk sac:  Not Visualized.

Embryo:  Not Visualized.

Cardiac Activity: Not Visualized.

MSD: 13.6  mm   6 w   2  d

Subchorionic hemorrhage:  None visualized.

Maternal uterus/adnexae: Normal.
IMPRESSION: Probable early intrauterine gestational sac, but no yolk sac, fetal
pole, or cardiac activity yet visualized. Recommend follow-up
quantitative B-HCG levels and follow-up US in 14 days to confirm and
assess viability. This recommendation follows SRU consensus
guidelines: Diagnostic Criteria for Nonviable Pregnancy Early in the
First Trimester. N Engl J Med 3923; [DATE].

By: Nomasibulele Moatshe M.D.

## 2018-12-05 DIAGNOSIS — R0989 Other specified symptoms and signs involving the circulatory and respiratory systems: Secondary | ICD-10-CM | POA: Diagnosis not present

## 2018-12-05 DIAGNOSIS — Z20828 Contact with and (suspected) exposure to other viral communicable diseases: Secondary | ICD-10-CM | POA: Diagnosis not present

## 2018-12-06 DIAGNOSIS — R0989 Other specified symptoms and signs involving the circulatory and respiratory systems: Secondary | ICD-10-CM | POA: Diagnosis not present

## 2018-12-06 DIAGNOSIS — Z20828 Contact with and (suspected) exposure to other viral communicable diseases: Secondary | ICD-10-CM | POA: Diagnosis not present

## 2019-01-09 DIAGNOSIS — J45909 Unspecified asthma, uncomplicated: Secondary | ICD-10-CM | POA: Diagnosis not present

## 2019-01-09 DIAGNOSIS — Z7189 Other specified counseling: Secondary | ICD-10-CM | POA: Diagnosis not present

## 2019-01-09 DIAGNOSIS — Z20822 Contact with and (suspected) exposure to covid-19: Secondary | ICD-10-CM | POA: Diagnosis not present

## 2019-01-09 DIAGNOSIS — B349 Viral infection, unspecified: Secondary | ICD-10-CM | POA: Diagnosis not present

## 2019-01-09 DIAGNOSIS — R0602 Shortness of breath: Secondary | ICD-10-CM | POA: Diagnosis not present

## 2019-01-09 DIAGNOSIS — J301 Allergic rhinitis due to pollen: Secondary | ICD-10-CM | POA: Diagnosis not present

## 2019-04-16 DIAGNOSIS — R05 Cough: Secondary | ICD-10-CM | POA: Diagnosis not present

## 2019-04-16 DIAGNOSIS — J029 Acute pharyngitis, unspecified: Secondary | ICD-10-CM | POA: Diagnosis not present

## 2019-04-16 DIAGNOSIS — H9202 Otalgia, left ear: Secondary | ICD-10-CM | POA: Diagnosis not present

## 2019-04-16 DIAGNOSIS — Z20822 Contact with and (suspected) exposure to covid-19: Secondary | ICD-10-CM | POA: Diagnosis not present

## 2019-06-22 DIAGNOSIS — R41840 Attention and concentration deficit: Secondary | ICD-10-CM | POA: Diagnosis not present

## 2019-06-22 DIAGNOSIS — R69 Illness, unspecified: Secondary | ICD-10-CM | POA: Diagnosis not present

## 2019-06-22 DIAGNOSIS — L989 Disorder of the skin and subcutaneous tissue, unspecified: Secondary | ICD-10-CM | POA: Diagnosis not present

## 2019-07-22 DIAGNOSIS — Z818 Family history of other mental and behavioral disorders: Secondary | ICD-10-CM | POA: Diagnosis not present

## 2019-07-22 DIAGNOSIS — R69 Illness, unspecified: Secondary | ICD-10-CM | POA: Diagnosis not present

## 2019-07-22 DIAGNOSIS — E559 Vitamin D deficiency, unspecified: Secondary | ICD-10-CM | POA: Diagnosis not present

## 2019-07-22 DIAGNOSIS — R41840 Attention and concentration deficit: Secondary | ICD-10-CM | POA: Diagnosis not present

## 2020-09-20 ENCOUNTER — Ambulatory Visit: Payer: Self-pay | Admitting: Advanced Practice Midwife

## 2020-09-20 ENCOUNTER — Other Ambulatory Visit: Payer: Self-pay

## 2020-09-20 ENCOUNTER — Encounter: Payer: Self-pay | Admitting: Advanced Practice Midwife

## 2020-09-20 DIAGNOSIS — Z720 Tobacco use: Secondary | ICD-10-CM

## 2020-09-20 DIAGNOSIS — Z113 Encounter for screening for infections with a predominantly sexual mode of transmission: Secondary | ICD-10-CM

## 2020-09-20 DIAGNOSIS — Z62810 Personal history of physical and sexual abuse in childhood: Secondary | ICD-10-CM | POA: Insufficient documentation

## 2020-09-20 DIAGNOSIS — F325 Major depressive disorder, single episode, in full remission: Secondary | ICD-10-CM

## 2020-09-20 DIAGNOSIS — J45909 Unspecified asthma, uncomplicated: Secondary | ICD-10-CM

## 2020-09-20 LAB — WET PREP FOR TRICH, YEAST, CLUE
Trichomonas Exam: NEGATIVE
Yeast Exam: NEGATIVE

## 2020-09-20 NOTE — Progress Notes (Signed)
Wet mount reviewed - no treatment indicated.   Floy Sabina, RN
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
## 2020-09-25 LAB — GONOCOCCUS CULTURE
